# Patient Record
Sex: Female | Born: 1985
Health system: Southern US, Community
[De-identification: ages and names within clinical notes are randomized; demographics above are authoritative.]

## PROBLEM LIST (undated history)

## (undated) DIAGNOSIS — M7730 Calcaneal spur, unspecified foot: Secondary | ICD-10-CM

## (undated) DIAGNOSIS — F319 Bipolar disorder, unspecified: Secondary | ICD-10-CM

## (undated) DIAGNOSIS — I10 Essential (primary) hypertension: Secondary | ICD-10-CM

## (undated) DIAGNOSIS — E282 Polycystic ovarian syndrome: Secondary | ICD-10-CM

## (undated) DIAGNOSIS — Q780 Osteogenesis imperfecta: Secondary | ICD-10-CM

---

## 2018-02-25 ENCOUNTER — Other Ambulatory Visit: Payer: Self-pay

## 2018-02-25 ENCOUNTER — Emergency Department (HOSPITAL_COMMUNITY)
Admission: EM | Admit: 2018-02-25 | Discharge: 2018-02-26 | Disposition: A | Discharge status: Home / Self Care | Payer: Self-pay | Attending: Emergency Medicine | Admitting: Emergency Medicine

## 2018-02-25 ENCOUNTER — Encounter (HOSPITAL_COMMUNITY): Payer: Self-pay | Admitting: Emergency Medicine

## 2018-02-25 DIAGNOSIS — H9201 Otalgia, right ear: Secondary | ICD-10-CM | POA: Insufficient documentation

## 2018-02-25 DIAGNOSIS — I10 Essential (primary) hypertension: Secondary | ICD-10-CM | POA: Insufficient documentation

## 2018-02-25 HISTORY — DX: Bipolar disorder, unspecified: F31.9

## 2018-02-25 HISTORY — DX: Polycystic ovarian syndrome: E28.2

## 2018-02-25 HISTORY — DX: Essential (primary) hypertension: I10

## 2018-02-25 MED ORDER — AZITHROMYCIN 250 MG PO TABS: 250.0000 mg | ORAL_TABLET | Freq: Every day | ORAL | 0 refills | Status: DC

## 2018-02-25 MED ORDER — OFLOXACIN 0.3 % OT SOLN: 5.0000 [drp] | Freq: Two times a day (BID) | OTIC | 0 refills | Status: AC

## 2018-02-25 NOTE — Discharge Instructions (Signed)
Please take all medications as directed.  You are given a referral to the ear nose and throat doctor.  Please call the office to make an appointment for follow-up.  You are also given information to [facility] in regards to your symptoms today.  Please make an appointment for follow-up in 1 week for reevaluation.  Please return to the ER for any new or worsening symptoms in the meantime.

## 2018-02-25 NOTE — ED Provider Notes (Signed)
[facility] Provider Note   CSN: [id number] Arrival date & time: 02/25/18  1936     History   Chief Complaint Chief Complaint  Patient presents with  . Otalgia    HPI Sara Pearson is a 32 y.o. female.  HPI   Patient is a 32 year old female history of bipolar 1, hypertension, PCOS who presents emergency department today for evaluation of right ear pain that has been ongoing for the last 2 3 days.  She reports pain and drainage of green fluid and blood from the right ear.  States she is Q-tips.  Reports chills but has not had any documented fevers at home.  Has had some rhinorrhea and a sore throat.  Denies any other symptoms.  Has not tried any interventions for her symptoms.  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  States she has been swimming recently and is supposed to wear earplugs when she swims that she often so she did not wear them recently.  Past Medical History:  Diagnosis Date  . Bipolar 1 disorder (HCC)   . Hypertension   . PCOS (polycystic ovarian syndrome)     There are no active problems to display for this patient.   History reviewed. No pertinent surgical history.   OB History   None      Home Medications    [place] to Admission medications   Medication Sig Start Date End Date Taking? Authorizing Provider  azithromycin (ZITHROMAX Z-PAK) 250 MG tablet Take 1 tablet (250 mg total) by mouth daily. Take 2 tablets on the first day of treatment. Then take 1 tablet per day for the next four days. 02/25/18   Xochitl Egle S, PA-C  ofloxacin (FLOXIN) 0.3 % OTIC solution Place 5 drops into the right ear 2 (two) times daily for 7 days. 02/25/18 03/04/18  Mekiyah Gladwell S, PA-C    Family History No family history on file.  Social History Social History   Tobacco Use  . Smoking status: Never Smoker    . Smokeless tobacco: Never Used  Substance Use Topics  . Alcohol use: Yes  . Drug use: Never     Allergies   Codeine; Iodine; Rocephin [ceftriaxone sodium in dextrose]; and Zofran [ondansetron hcl]   Review of Systems Review of Systems  Constitutional: Positive for chills. Negative for fever.  HENT: Positive for ear pain, postnasal drip, rhinorrhea and sore throat.   Eyes: Negative for visual disturbance.  Respiratory: Negative for shortness of breath.   Cardiovascular: Negative for chest pain.  Gastrointestinal: Negative for abdominal pain, nausea and vomiting.  Genitourinary: Negative for dysuria.  Neurological: Negative for headaches.   Physical Exam Updated Vital Signs BP (!) 142/98 (BP Location: Right Arm)   Pulse (!) 104   Temp 98.7 F (37.1 C) (Oral)   Resp 16   SpO2 100%   Physical Exam  Constitutional: She appears well-developed and well-nourished. No distress.  HENT:  Head: Normocephalic and atraumatic.  Left external ear normal.  Left TM unable to be fully visualized.  Right canal appears raw and erythematous.  There is no effusion behind the right TM.  No obvious TM perforation.  No obvious erythema.  No pharyngeal erythema.  No tonsillar swelling or exudates noted.  Postnasal drip noted.  Tolerating secretions.  Eyes: Conjunctivae and EOM are normal.  Neck: Normal  range of motion. Neck supple.  Cardiovascular: Normal rate and regular rhythm.  Pulmonary/Chest: Effort normal and breath sounds normal. She has no wheezes.  Abdominal: Soft. There is no guarding.  Musculoskeletal: Normal range of motion.  Lymphadenopathy:    She has no cervical adenopathy.  Neurological: She is alert.  Skin: Skin is warm and dry.  Psychiatric: She has a normal mood and affect.  Nursing note and vitals reviewed.  ED Treatments / Results  Labs (all labs ordered are listed, but only abnormal results are displayed) Labs Reviewed - No data to  display  EKG None  Radiology No results found.  Procedures Procedures (including critical care time)  Medications Ordered in ED Medications - No data to display   Initial Impression / Assessment and Plan / ED Course  I have reviewed the triage vital signs and the nursing notes.  Pertinent labs & imaging results that were available during my care of the patient were reviewed by me and considered in my medical decision making (see chart for details).   10:44 PM pt not in room   Final Clinical Impressions(s) / ED Diagnoses   Final diagnoses:  Otalgia of right ear   Patient presenting with right-sided otalgia for the last 2 to 3 days.  States that she has had pain and drainage from the right ear.  Recently went swimming.  No fever here.  Borderline tachycardic but otherwise vitals stable.  No evidence of mastoiditis or other emergent process at this time.  Given her history, will treat patient with otic drops.  Given her effusion will also cover her for a potential otitis media.  Given referral to ENT.  Advised follow-up with [facility] for reevaluation.  Return precautions discussed.  Patient and friend at bedside voiced understanding the plan and reasons to return immediately to the ED.  All questions answered  ED Discharge Orders         Ordered    ofloxacin (FLOXIN) 0.3 % OTIC solution  2 times daily     02/25/18 2347    azithromycin (ZITHROMAX Z-PAK) 250 MG tablet  Daily     02/25/18 2347           Karrie Meres, PA-C 02/25/18 2355    Terrilee Files, MD 02/26/18 1104

## 2018-02-25 NOTE — ED Triage Notes (Signed)
Pt c/o pain in her right ear with some drainage x 2 days.

## 2018-02-25 NOTE — ED Notes (Signed)
See EDP assessment 

## 2019-03-14 ENCOUNTER — Other Ambulatory Visit: Payer: Self-pay

## 2019-03-14 ENCOUNTER — Emergency Department (HOSPITAL_COMMUNITY)
Admission: EM | Admit: 2019-03-14 | Discharge: 2019-03-14 | Disposition: A | Discharge status: Home / Self Care | Payer: Self-pay | Attending: Emergency Medicine | Admitting: Emergency Medicine

## 2019-03-14 ENCOUNTER — Encounter (HOSPITAL_COMMUNITY): Payer: Self-pay

## 2019-03-14 DIAGNOSIS — R070 Pain in throat: Secondary | ICD-10-CM | POA: Insufficient documentation

## 2019-03-14 DIAGNOSIS — I10 Essential (primary) hypertension: Secondary | ICD-10-CM | POA: Insufficient documentation

## 2019-03-14 LAB — CBC WITH DIFFERENTIAL/PLATELET
Abs Immature Granulocytes: 0.06 10*3/uL (ref 0.00–0.07)
Basophils Absolute: 0.1 10*3/uL (ref 0.0–0.1)
Basophils Relative: 0 %
Eosinophils Absolute: 0.3 10*3/uL (ref 0.0–0.5)
Eosinophils Relative: 2 %
HCT: 44 % (ref 36.0–46.0)
Hemoglobin: 14.9 g/dL (ref 12.0–15.0)
Immature Granulocytes: 0 %
Lymphocytes Relative: 34 %
Lymphs Abs: 5.2 10*3/uL — ABNORMAL HIGH (ref 0.7–4.0)
MCH: 28.2 pg (ref 26.0–34.0)
MCHC: 33.9 g/dL (ref 30.0–36.0)
MCV: 83.2 fL (ref 80.0–100.0)
Monocytes Absolute: 0.7 10*3/uL (ref 0.1–1.0)
Monocytes Relative: 5 %
Neutro Abs: 8.9 10*3/uL — ABNORMAL HIGH (ref 1.7–7.7)
Neutrophils Relative %: 59 %
Platelets: 231 10*3/uL (ref 150–400)
RBC: 5.29 MIL/uL — ABNORMAL HIGH (ref 3.87–5.11)
RDW: 12.5 % (ref 11.5–15.5)
WBC: 15.2 10*3/uL — ABNORMAL HIGH (ref 4.0–10.5)
nRBC: 0 % (ref 0.0–0.2)

## 2019-03-14 LAB — BASIC METABOLIC PANEL
Anion gap: 8 (ref 5–15)
BUN: 6 mg/dL (ref 6–20)
CO2: 26 mmol/L (ref 22–32)
Calcium: 9.4 mg/dL (ref 8.9–10.3)
Chloride: 104 mmol/L (ref 98–111)
Creatinine, Ser: 0.66 mg/dL (ref 0.44–1.00)
GFR calc Af Amer: >60 mL/min (ref >60)
GFR calc non Af Amer: >60 mL/min (ref >60)
Glucose, Bld: 97 mg/dL (ref 70–99)
Potassium: 3.8 mmol/L (ref 3.5–5.1)
Sodium: 138 mmol/L (ref 135–145)

## 2019-03-14 LAB — GROUP A STREP BY PCR: Group A Strep by PCR: NOT DETECTED

## 2019-03-14 MED ORDER — DEXAMETHASONE SODIUM PHOSPHATE 10 MG/ML IJ SOLN
10.0000 mg | Freq: Once | INTRAMUSCULAR | Status: AC
  Administered 2019-03-14: 10 mg via INTRAVENOUS
  Filled 2019-03-14: qty 1

## 2019-03-14 MED ORDER — DOXYCYCLINE HYCLATE 100 MG PO CAPS: 100.0000 mg | ORAL_CAPSULE | Freq: Two times a day (BID) | ORAL | 0 refills | Status: AC

## 2019-03-14 NOTE — ED Provider Notes (Signed)
[facility] Provider [facility]   CSN: [id number] Arrival date & time: 03/14/19  1546     History   Chief Complaint Chief Complaint  Patient presents with  . Allergic Reaction    HPI Sara Pearson is a 33 y.o. female presenting for evaluation of throat discomfort.  Patient states she was diagnosed with a dental infection on Friday.  Started on clindamycin.  She was taking as prescribed until yesterday when she felt like her symptoms are worsening so she took 2 doses of the clindamycin last night.  She then started to feel like her throat was swollen and it was difficulty to swallow.  She reports pain in her throat.  Patient started to feel a chest heaviness and discomfort.  She states she has taken approximately 20 Benadryl since symptoms began last night.  She has not taken any further doses of the clindamycin.  She denies rash, nausea, vomiting.  Patient denies fever, shortness of breath, chest pain.  She denies history of similar reaction.     HPI  Past Medical History:  Diagnosis Date  . Bipolar 1 disorder (HCC)   . Hypertension   . PCOS (polycystic ovarian syndrome)     There are no active problems to display for this patient.   History reviewed. No pertinent surgical history.   OB History   No obstetric history on file.      Home Medications    [place] to Admission medications   Medication Sig Start Date End Date Taking? Authorizing Provider  azithromycin (ZITHROMAX Z-PAK) 250 MG tablet Take 1 tablet (250 mg total) by mouth daily. Take 2 tablets on the first day of treatment. Then take 1 tablet per day for the next four days. 02/25/18   Couture, Cortni S, PA-C  doxycycline (VIBRAMYCIN) 100 MG capsule Take 1 capsule (100 mg total) by mouth 2 (two) times daily for 7 days. 03/14/19 03/21/19  Daliah Chaudoin, PA-C    Family History No family history on file.  Social History Social History   Tobacco Use  . Smoking status: Never  Smoker  . Smokeless tobacco: Never Used  Substance Use Topics  . Alcohol use: Yes  . Drug use: Never     Allergies   Codeine, Iodine, Rocephin [ceftriaxone sodium in dextrose], and Zofran [ondansetron hcl]   Review of Systems Review of Systems  HENT:       Throat discomfort  Respiratory:       Chest heaviness  All other systems reviewed and are negative.    Physical Exam Updated Vital Signs BP (!) 135/93   Pulse 73   Temp 98.9 F (37.2 C) (Oral)   Resp 16   SpO2 98%   Physical Exam Vitals signs and nursing note reviewed.  Constitutional:      General: She is not in acute distress.    Appearance: She is well-developed.     Comments: Obese female sitting in the chair in no acute distress  HENT:     Head: Normocephalic and atraumatic.     Comments: Mild right-sided facial swelling without swelling extending beyond the ramus of the mandible.  No trismus.  Handling secretions easily.  Uvula midline with equal palate rise.  No tonsillar swelling.  Throat mildly erythematous.  No pain under the tongue. Eyes:     Conjunctiva/sclera: Conjunctivae normal.     Pupils: Pupils are equal, round, and reactive to light.  Neck:     Musculoskeletal: Normal range of  motion and neck supple.  Cardiovascular:     Rate and Rhythm: Normal rate and regular rhythm.     Pulses: Normal pulses.  Pulmonary:     Effort: Pulmonary effort is normal. No respiratory distress.     Breath sounds: Normal breath sounds. No wheezing.     Comments: Speaking in full sentences.  Clear lung sounds in all fields.  No signs of respiratory distress. Abdominal:     General: There is no distension.     Palpations: Abdomen is soft.     Tenderness: There is no abdominal tenderness.  Musculoskeletal: Normal range of motion.  Skin:    General: Skin is warm and dry.  Neurological:     Mental Status: She is alert and oriented to person, place, and time.      ED Treatments / Results  Labs (all labs  ordered are listed, but only abnormal results are displayed) Labs Reviewed  CBC WITH DIFFERENTIAL/PLATELET - Abnormal; Notable for the following components:      Result Value   WBC 15.2 (*)    RBC 5.29 (*)    Neutro Abs 8.9 (*)    Lymphs Abs 5.2 (*)    All other components within normal limits  GROUP A STREP BY PCR  BASIC METABOLIC PANEL    EKG None  Radiology No results found.  Procedures Procedures (including critical care time)  Medications Ordered in ED Medications  dexamethasone (DECADRON) injection 10 mg (10 mg Intravenous Given 03/14/19 1702)     Initial Impression / Assessment and Plan / ED Course  I have reviewed the triage vital signs and the nursing notes.  Pertinent labs & imaging results that were available during my care of the patient were reviewed by me and considered in my medical decision making (see chart for details).        Patient presenting for evaluation of throat discomfort and chest heaviness after taking 2 doses of clindamycin last night.  Physical exam shows patient appears nontoxic.  No rash or signs of anaphylaxis.  Patient is handling secretions well.  No sign of peritonsillar abscess, airway is intact.  No sign of Ludwig's at this time.  Will give Decadron and obtain basic labs, although I have low suspicion that she will need CT.  As she is having throat pain and has a mildly erythematous throat, will obtain strep, although low suspicion as she is currently on antibiotics.  On reassessment after Decadron, patient reports symptoms have completely resolved.  Case discussed with attending, Dr. Langston Masker agrees to plan.  Patient with slight leukocytosis at 15, could be due to dental infection.  Will switch patient's antibiotics in case this is due to allergic reaction, although I favor worsening infection/swelling as cause of the symptoms.  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  Discussed with pharmacy, who  agrees that doxycycline would be a good choice.  Encourage patient to call her dentist tomorrow to discuss what happened and to see if she should still follow-up on Wednesday for dental extraction.  At this time, patient appears safe for discharge.  Return precautions given.  Patient states she understands and agrees to plan.  Final Clinical Impressions(s) / ED Diagnoses   Final diagnoses:  Throat discomfort    ED Discharge Orders         Ordered    doxycycline (VIBRAMYCIN) 100 MG capsule  2 times daily     03/14/19 1839  Alveria Apley, PA-C 03/14/19 1928    Terald Sleeper, MD 03/15/19 [phone]

## 2019-03-14 NOTE — ED Triage Notes (Signed)
Pt reports taking clindamycin for a tooth abscess since Friday and now thinks she has an allergic reaction to it. States yesterday her throat started to swell and she can barely open her mouth. Hard to swallow. Pt maintaining secretions at this time, oxygen saturation 97%.

## 2019-03-14 NOTE — Discharge Instructions (Signed)
Take antibiotics as prescribed.  Take entire course, even if your symptoms improve. Make sure you call your dentist tomorrow to let them know what happened.  They may want to extend your antibiotic treatment for several more days [place] to tooth extraction. Return to the emergency room if you develop fevers, inability to open your mouth, increased difficulty breathing, or any new, worsening, or concerning symptoms.

## 2019-09-01 ENCOUNTER — Ambulatory Visit: Payer: Medicaid Other | Attending: Internal Medicine

## 2019-09-01 DIAGNOSIS — Z23 Encounter for immunization: Secondary | ICD-10-CM

## 2019-09-01 NOTE — Progress Notes (Signed)
   Covid-19 Vaccination Clinic  Name:  Sara Pearson    MRN: [id number] DOB: Oct 09, 1985  09/01/2019  Ms. Ribble was observed post Covid-19 immunization for 30 minutes based on pre-vaccination screening without incident. She was provided with Vaccine Information Sheet and instruction to access the V-Safe system.   Ms. Bloomfield was instructed to call 911 with any severe reactions post vaccine: Marland Kitchen Difficulty breathing  . Swelling of face and throat  . A fast heartbeat  . A bad rash all over body  . Dizziness and weakness   Immunizations Administered    Name Date Dose VIS Date Route   Pfizer COVID-19 Vaccine 09/01/2019  2:28 PM 0.3 mL 05/27/2019 Intramuscular   Manufacturer: [employer], [employer]   Lot: [id number]   NDC: [address]-4

## 2019-09-12 ENCOUNTER — Encounter (HOSPITAL_COMMUNITY): Payer: Self-pay | Admitting: *Deleted

## 2019-09-12 ENCOUNTER — Emergency Department (HOSPITAL_COMMUNITY)
Admission: EM | Admit: 2019-09-12 | Discharge: 2019-09-12 | Disposition: A | Discharge status: Home / Self Care | Payer: Self-pay | Attending: Emergency Medicine | Admitting: Emergency Medicine

## 2019-09-12 ENCOUNTER — Emergency Department (HOSPITAL_COMMUNITY): Payer: Self-pay

## 2019-09-12 DIAGNOSIS — S93602A Unspecified sprain of left foot, initial encounter: Secondary | ICD-10-CM

## 2019-09-12 DIAGNOSIS — Y92002 Bathroom of unspecified non-institutional (private) residence single-family (private) house as the place of occurrence of the external cause: Secondary | ICD-10-CM | POA: Insufficient documentation

## 2019-09-12 DIAGNOSIS — Q780 Osteogenesis imperfecta: Secondary | ICD-10-CM | POA: Insufficient documentation

## 2019-09-12 DIAGNOSIS — Y999 Unspecified external cause status: Secondary | ICD-10-CM | POA: Insufficient documentation

## 2019-09-12 DIAGNOSIS — Y93E1 Activity, personal bathing and showering: Secondary | ICD-10-CM | POA: Insufficient documentation

## 2019-09-12 DIAGNOSIS — W2209XA Striking against other stationary object, initial encounter: Secondary | ICD-10-CM | POA: Insufficient documentation

## 2019-09-12 HISTORY — DX: Osteogenesis imperfecta: Q78.0

## 2019-09-12 HISTORY — DX: Calcaneal spur, unspecified foot: M77.30

## 2019-09-12 NOTE — ED Triage Notes (Signed)
Left foot pain since she struck her foot on the bathtub. Pt is tearful, hx of bone spurs also

## 2019-09-12 NOTE — ED Provider Notes (Signed)
[facility] EMERGENCY DEPARTMENT Provider Note   CSN: [id number] Arrival date & time: 09/12/19  1647     History Chief Complaint  Patient presents with  . Foot Pain    Sara Pearson is a 34 y.o. female with PMhx HTN, PCOS, and bipolar disorder who presents to the ED today complaining of sudden onset, constant, achy/throbbing, left medial foot pain that occurred earlier today.  States she was getting out of the tub when she accidentally hit the medial aspect of her left foot against the tub causing immediate pain.  States she took one of her boyfriends hydrocodone without relief.  She states she is limping due to the pain.  Notes history of heel spurs but states his pain is worse.  Denies weakness, numbness, tingling, any other associated symptoms.  The history is provided by the patient and medical records.       Past Medical History:  Diagnosis Date  . Bipolar 1 disorder ([place])   . Heel spur   . Hypertension   . Osteogenesis imperfecta   . PCOS (polycystic ovarian syndrome)     There are no problems to display for this patient.   History reviewed. No pertinent surgical history.   OB History   No obstetric history on file.     No family history on file.  Social History   Tobacco Use  . Smoking status: Never Smoker  . Smokeless tobacco: Never Used  Substance Use Topics  . Alcohol use: Yes  . Drug use: Never    Home Medications [place] to Admission medications   Medication Sig Start Date End Date Taking? Authorizing Provider  azithromycin (ZITHROMAX Z-PAK) 250 MG tablet Take 1 tablet (250 mg total) by mouth daily. Take 2 tablets on the first day of treatment. Then take 1 tablet per day for the next four days. 02/25/18   Couture, Cortni S, PA-C    Allergies    Codeine, Iodine, Rocephin [ceftriaxone sodium in dextrose], and Zofran [ondansetron hcl]  Review of Systems   Review of Systems  Constitutional: Negative for chills and fever.   Musculoskeletal: Positive for arthralgias.  Neurological: Negative for weakness and numbness.    Physical Exam Updated Vital Signs BP (!) 145/92 (BP Location: Left Arm)   Pulse 90   Temp 98.7 F (37.1 C) (Oral)   Resp 14   Ht 5\' 6"  (1.676 m)   Wt 127 kg   LMP 08/27/2019   SpO2 97%   BMI 45.19 kg/m   Physical Exam Vitals and nursing note reviewed.  Constitutional:      Appearance: She is not ill-appearing.  HENT:     Head: Normocephalic and atraumatic.  Eyes:     Conjunctiva/sclera: Conjunctivae normal.  Cardiovascular:     Rate and Rhythm: Normal rate and regular rhythm.  Pulmonary:     Effort: Pulmonary effort is normal.     Breath sounds: Normal breath sounds.  Musculoskeletal:     Comments: Mild amount of swelling to the medial aspect of the left foot.  Tenderness to palpation along the medial aspect of the heel.  No tenderness to medial or lateral malleolus of ankle.  No tenderness to dorsum of foot.  Cap refill less than 2 seconds in all digits.  Able to wiggle toes without difficulty.  Range of motion of ankle intact.  Skin:    General: Skin is warm and dry.     Coloration: Skin is not jaundiced.  Neurological:  Mental Status: She is alert.     ED Results / Procedures / Treatments   Labs (all labs ordered are listed, but only abnormal results are displayed) Labs Reviewed - No data to display  EKG None  Radiology DG Foot Complete Left  Result Date: 09/12/2019 CLINICAL DATA:  Left foot pain after trauma EXAM: LEFT FOOT - COMPLETE 3+ VIEW COMPARISON:  None. FINDINGS: Frontal, oblique, lateral views of the left foot are obtained. No fracture, subluxation, or dislocation. Joint spaces are well preserved. Prominent calcaneal spurs. Mild dorsal soft tissue swelling of the forefoot. IMPRESSION: 1. Mild soft tissue swelling.  No underlying fracture. Electronically Signed   By: Sharlet Salina M.D.   On: 09/12/2019 18:53    Procedures Procedures (including  critical care time)  Medications Ordered in ED Medications - No data to display  ED Course  I have reviewed the triage vital signs and the nursing notes.  Pertinent labs & imaging results that were available during my care of the patient were reviewed by me and considered in my medical decision making (see chart for details).    MDM Rules/Calculators/A&P                      34 year old female presents to the ED today complaining of sudden onset left foot pain after hitting it again at the edge of the tub.  She has mild amount of swelling to the medial aspect with tenderness to palpation.  Motion intact to ankle and toes.  SHe is neurovascularly intact.  X-ray was obtained without any signs of fracture.  Suspect soft tissue injury.  Ace wrap applied and patient given crutches to help with discomfort with ambulation.  She is advised to take ibuprofen and Tylenol as needed for pain.  Rice therapy discussed.  Patient given information for PCP as well as podiatry to follow-up with.  She is in agreement with plan and stable for discharge home.  This note was prepared using Dragon voice recognition software and may include unintentional dictation errors due to the inherent limitations of voice recognition software.  Final Clinical Impression(s) / ED Diagnoses Final diagnoses:  Sprain of left foot, initial encounter    Rx / DC Orders ED Discharge Orders    None       Discharge Instructions     Please use crutches and ace wrap to help with discomfort and pain While at home please ice and elevate  your foot  You may take Ibuprofen and Tylenol as needed for pain Follow up with podiatry if you pain continues        Tanda Rockers, PA-C 09/12/19 2019    Milagros Loll, MD 09/13/19 [phone]

## 2019-09-12 NOTE — Progress Notes (Signed)
Orthopedic Tech Progress Note Patient Details:  Sara Pearson [birth year] [id number]  Ortho Devices Type of Ortho Device: Ace wrap, Crutches Ortho Device/Splint Location: LLE Ortho Device/Splint Interventions: Application   Post Interventions Patient Tolerated: Well Instructions Provided: Care of device   Genelle Bal Sara Pearson 09/12/2019, 8:34 PM

## 2019-09-12 NOTE — Discharge Instructions (Signed)
Please use crutches and ace wrap to help with discomfort and pain While at home please ice and elevate  your foot  You may take Ibuprofen and Tylenol as needed for pain Follow up with podiatry if you pain continues

## 2019-09-27 ENCOUNTER — Ambulatory Visit: Payer: Medicaid Other | Attending: Internal Medicine

## 2019-09-27 DIAGNOSIS — Z23 Encounter for immunization: Secondary | ICD-10-CM

## 2019-09-27 NOTE — Progress Notes (Signed)
   Covid-19 Vaccination Clinic  Name:  Sara Pearson    MRN: [id number] DOB: October 04, 1985  09/27/2019  [name] was observed post Covid-19 immunization for 15 minutes without incident. She was provided with Vaccine Information Sheet and instruction to access the V-Safe system.   [name] was instructed to call 911 with any severe reactions post vaccine: Marland Kitchen Difficulty breathing  . Swelling of face and throat  . A fast heartbeat  . A bad rash all over body  . Dizziness and weakness   Immunizations Administered    Name Date Dose VIS Date Route   Pfizer COVID-19 Vaccine 09/27/2019  2:39 PM 0.3 mL 05/27/2019 Intramuscular   Manufacturer: [employer], [employer]   Lot: [id number]   NDC: [address]-9

## 2020-01-09 ENCOUNTER — Other Ambulatory Visit: Payer: Self-pay

## 2020-01-09 ENCOUNTER — Emergency Department (HOSPITAL_COMMUNITY)
Admission: EM | Admit: 2020-01-09 | Discharge: 2020-01-10 | Disposition: A | Discharge status: Home / Self Care | Payer: Medicaid Other | Attending: Emergency Medicine | Admitting: Emergency Medicine

## 2020-01-09 ENCOUNTER — Encounter (HOSPITAL_COMMUNITY): Payer: Self-pay | Admitting: Emergency Medicine

## 2020-01-09 DIAGNOSIS — R509 Fever, unspecified: Secondary | ICD-10-CM | POA: Insufficient documentation

## 2020-01-09 DIAGNOSIS — Z5321 Procedure and treatment not carried out due to patient leaving prior to being seen by health care provider: Secondary | ICD-10-CM | POA: Insufficient documentation

## 2020-01-09 DIAGNOSIS — L02214 Cutaneous abscess of groin: Secondary | ICD-10-CM | POA: Insufficient documentation

## 2020-01-09 DIAGNOSIS — B9689 Other specified bacterial agents as the cause of diseases classified elsewhere: Secondary | ICD-10-CM | POA: Insufficient documentation

## 2020-01-09 NOTE — ED Triage Notes (Signed)
Pt c/o abscess to right groin, hx of same. Reports fevers at home.

## 2020-04-13 ENCOUNTER — Other Ambulatory Visit: Payer: Self-pay

## 2020-04-13 ENCOUNTER — Ambulatory Visit (HOSPITAL_COMMUNITY)
Admission: EM | Admit: 2020-04-13 | Discharge: 2020-04-13 | Disposition: A | Discharge status: Home / Self Care | Payer: No Payment, Other | Attending: Psychiatry | Admitting: Psychiatry

## 2020-04-13 DIAGNOSIS — Z9151 Personal history of suicidal behavior: Secondary | ICD-10-CM | POA: Insufficient documentation

## 2020-04-13 DIAGNOSIS — F3162 Bipolar disorder, current episode mixed, moderate: Secondary | ICD-10-CM | POA: Diagnosis not present

## 2020-04-13 DIAGNOSIS — F1290 Cannabis use, unspecified, uncomplicated: Secondary | ICD-10-CM | POA: Insufficient documentation

## 2020-04-13 NOTE — ED Notes (Signed)
Pt discharged in no acute distress. Verbalized understanding of AVS instructions explained by Clinical research associate. All belongings returned to pt intact from locker #29. Denies SI/HI upon discharge. Pt escorted to lobby via staff. Safety maintained.

## 2020-04-13 NOTE — BH Assessment (Signed)
Comprehensive Clinical Assessment (CCA) Note  04/13/2020 Sara Pearson [id number]  Sara Pearson is a 34 year old female presenting to [facility] voluntarily with chief complaint of depression. Sara Pearson states that she moved here two years ago from [place] where she was financially stable and able to afford medication management and therapy. Patient reports moving here to get better health care for her husband who has muscular dystrophy. Patient reports some financial strain and difficulty with getting mental health treatment due to no insurance. Patient reports that she has been unmedicated for the past two years and feels trapped in sadness. Patient reports stressors of starting a new job, taking care of her husband and financial issues Im under a lot of stress. Patient reports symptoms of crying spells, low energy, difficulty sleeping, fatigue and difficulty concentrating. Patient denies current SI but has attempted suicidal multiple times. Patient states her last suicidal attempt was in 2013 via overdose. Patient states that she has been inpatient 3 times and her last inpatient treatment was in 2014. Patient reports a lot of trauma has happened in her life. Patient reports being diagnosed with bipolar disorder in 2010 and has a history of taking lithium. Patient reports that she went to [facility] for help, and they instructed her to come to [facility]. Patient states she was under the impression that [facility] provided 24-hour outpatient services. Patient states multiple times she is not a threat to herself or anyone else and all she needs is therapy and medications. Patient expresses her frustration of how hard it is to get medical and mental health treatment without insurance. Patient has also started a new job and works 8-5 and reports difficulty with trying to make it to open access for services. Patient was instructed to call [facility] and schedule an appointment for services if she is not  able to make it to open access.    Per Sara Heinrich, NP, patient is recommended for discharge with follow up to [facility] for outpatient services.   Visit Diagnosis:      ICD-10-CM   1. Bipolar 1 disorder, mixed, moderate (HCC)  F31.62       CCA Screening, Triage and Referral (STR)  Patient Reported Information How did you hear about [place]? Other (Comment)  Referral name: [facility]  Referral phone number: No data recorded  Whom do you see for routine medical problems? I don't have a doctor  Practice/Facility Name: No data recorded Practice/Facility Phone Number: No data recorded Name of Contact: No data recorded Contact Number: No data recorded Contact Fax Number: No data recorded Prescriber Name: No data recorded Prescriber Address (if known): No data recorded  What Is the Reason for Your Visit/Call Today? Depression  How Long Has This Been Causing You Problems? > than 6 months  What Do You Feel Would Help You the Most Today? Medication;Therapy   Have You Recently Been in Any Inpatient Treatment (Hospital/Detox/Crisis Center/28-Day Program)? No  Name/Location of Program/Hospital:No data recorded How Long Were You There? No data recorded When Were You Discharged? No data recorded  Have You Ever Received Services From [facility] Before? Yes  Who Do You See at [facility]? ER   Have You Recently Had Any Thoughts About Hurting Yourself? No  Are You Planning to Commit Suicide/Harm Yourself At This time? No   Have you Recently Had Thoughts About Hurting Someone Sara Pearson? No  Explanation: No data recorded  Have You Used Any Alcohol or Drugs in the Past 24  Hours? Yes  How Long Ago Did You Use Drugs or Alcohol? No data recorded What Did You Use and How Much? 1 joint   Do You Currently Have a Therapist/Psychiatrist? No  Name of Therapist/Psychiatrist: No data recorded  Have You Been Recently Discharged From Any Office Practice or Programs?  No  Explanation of Discharge From Practice/Program: No data recorded    CCA Screening Triage Referral Assessment Type of Contact: Face-to-Face  Is this Initial or Reassessment? No data recorded Date Telepsych consult ordered in [facility]:  No data recorded Time Telepsych consult ordered in [facility]:  No data recorded  Patient Reported Information Reviewed? Yes  Patient Left Without Being Seen? No data recorded Reason for Not Completing Assessment: No data recorded  Collateral Involvement: None   Does Patient Have a Court Appointed Legal Guardian? No data recorded Name and Contact of Legal Guardian: No data recorded If Minor and Not Living with Parent(s), Who has Custody? No data recorded Is CPS involved or ever been involved? No data recorded Is APS involved or ever been involved? No data recorded  Patient Determined To Be At Risk for Harm To Self or Others Based on Review of Patient Reported Information or Presenting Complaint? No  Method: No data recorded Availability of Means: No data recorded Intent: No data recorded Notification Required: No data recorded Additional Information for Danger to Others Potential: No data recorded Additional Comments for Danger to Others Potential: No data recorded Are There Guns or Other Weapons in Your Home? No data recorded Types of Guns/Weapons: No data recorded Are These Weapons Safely Secured?                            No data recorded Who Could Verify You Are Able To Have These Secured: No data recorded Do You Have any Outstanding Charges, Pending Court Dates, Parole/Probation? No data recorded Contacted To Inform of Risk of Harm To Self or Others: No data recorded  [facility]   Does Patient Present under Involuntary Commitment? No  IVC Papers Initial File Date: No data recorded  [place]   Patient Currently Receiving the Following Services: No data recorded  Determination  of Need: No data recorded  Options For Referral: Medication Management;Intensive Outpatient Therapy     CCA Biopsychosocial  Intake/Chief Complaint:  CCA Intake With Chief Complaint CCA Part Two Date: 04/13/20 Chief Complaint/Presenting Problem: Depression Patient's Currently Reported Symptoms/Problems: Patient reports symptoms of crying spells, low energy, difficulty sleeping, fatigue and difficulty concentrating Individual's Strengths: UTA Individual's Preferences: UTA Individual's Abilities: UTA Type of Services Patient Feels Are Needed: Medication and therapy  Mental Health Symptoms Depression:  Depression: Fatigue, Sleep (too much or little), Tearfulness, Duration of symptoms greater than two weeks, Change in energy/activity  Mania:  Mania: None  Anxiety:   Anxiety: Sleep  Psychosis:  Psychosis: None  Trauma:  Trauma: None  Obsessions:  Obsessions: None  Compulsions:  Compulsions: None  Inattention:  Inattention: None  Hyperactivity/Impulsivity:  Hyperactivity/Impulsivity: N/A  Oppositional/Defiant Behaviors:  Oppositional/Defiant Behaviors: None  Emotional Irregularity:  Emotional Irregularity: None  Other Mood/Personality Symptoms:      Mental Status Exam Appearance and self-care  Stature:  Stature: Average  Weight:  Weight: Overweight  Clothing:  Clothing: Age-appropriate  Grooming:  Grooming: Normal  Cosmetic use:  Cosmetic Use: None  Posture/gait:  Posture/Gait: Normal  Motor activity:  Motor Activity: Not Remarkable  Sensorium  Attention:  Attention: Normal  Concentration:  Concentration: Normal  Orientation:  Orientation: Time, Situation, Place, Person  Recall/memory:  Recall/Memory: Normal  Affect and Mood  Affect:  Affect: Full Range  Mood:  Mood: Depressed  Relating  Eye contact:  Eye Contact: Normal  Facial expression:  Facial Expression: Responsive  Attitude toward examiner:  Attitude Toward Examiner: Cooperative  Thought and Language  Speech  flow: Speech Flow: Clear and Coherent  Thought content:  Thought Content: Appropriate to Mood and Circumstances  Preoccupation:  Preoccupations: None  Hallucinations:  Hallucinations: None  Organization:     Company secretary of Knowledge:  Fund of Knowledge: Good  Intelligence:  Intelligence: Average  Abstraction:  Abstraction: Normal  Judgement:  Judgement: Good  Reality Testing:  Reality Testing: Adequate  Insight:  Insight: Good  Decision Making:  Decision Making: Normal  Social Functioning  Social Maturity:  Social Maturity: Responsible  Social Judgement:  Social Judgement: Normal  Stress  Stressors:  Stressors: Illness, Work, Transitions, Doctor, hospital Ability:  Coping Ability: Building surveyor Deficits:  Skill Deficits: None  Supports:  Supports: Family     Religion:    Leisure/Recreation:    Exercise/Diet: Exercise/Diet Do You Have Any Trouble Sleeping?: Yes   CCA Employment/Education  Employment/Work Situation: Employment / Work Environmental consultant job has been impacted by current illness: No Where was the patient employed at that time?: [employer] Has patient ever been in the [employer]?: No  Education: Education Is Patient Currently Attending School?: No Did Garment/textile technologist From [employer]?: Yes Did Theme park manager?: Yes Did Designer, television/film set?:  (UTA)   CCA Family/Childhood History  Family and Relationship History: Family history Marital status: Married What types of issues is patient dealing with in the relationship?: Takes care of husband due to medical issues What is your sexual orientation?: Heterosexual Does patient have children?: No  Childhood History:  Childhood History Additional childhood history information: UTA Description of patient's relationship with caregiver when they were a child: UTA Patient's description of current relationship with people who raised him/her: UTA How were you  disciplined when you got in trouble as a child/adolescent?: UTA  Child/Adolescent Assessment:     CCA Substance Use  Alcohol/Drug Use: Alcohol / Drug Use Pain Medications: See MAR Prescriptions: See MAR Over the Counter: See MAR History of alcohol / drug use?: Yes Substance #1 Name of Substance 1: Marijuana 1 - Amount (size/oz): "1 joint" 1 - Frequency: daily 1 - Duration: ongoing 1 - Last Use / Amount: "this morning" Substance #2 Name of Substance 2: Alcohol 2 - Amount (size/oz): "2 glasses of wine" 2 - Frequency: daily 2 - Duration: ongoing 2 - Last Use / Amount: last night     ASAM's:  Six Dimensions of Multidimensional Assessment  Dimension 1:  Acute Intoxication and/or Withdrawal Potential:      Dimension 2:  Biomedical Conditions and Complications:      Dimension 3:  Emotional, Behavioral, or Cognitive Conditions and Complications:     Dimension 4:  Readiness to Change:     Dimension 5:  Relapse, Continued use, or Continued Problem Potential:     Dimension 6:  Recovery/Living Environment:     ASAM Severity Score:    ASAM Recommended Level of Treatment:     Substance use Disorder (SUD)    Recommendations for Services/Supports/Treatments: Recommendations for Services/Supports/Treatments Recommendations For Services/Supports/Treatments: Medication Management, Individual Therapy   Per Sara Heinrich, NP, patient is recommended for discharge with  follow up to [facility] for outpatient services.  Nimrat Woolworth L Janee Morn

## 2020-04-13 NOTE — ED Triage Notes (Signed)
34 yo female walk-in due to increased depression and feeling overwhelmed due to being caregiver of disabled husband, starting a new job and financial stressors. Denies SI/HI/AVH. States, "I just really need help with medication to manage my depression and some therapy. Elevated BP 159/112. Pt states, "I'm unable to pay for medication for my blood pressure". Writer provided education on consequences of uncontrolled BP. Pt verbalized understanding. NP made aware.

## 2020-04-13 NOTE — Discharge Instructions (Addendum)

## 2020-04-13 NOTE — ED Provider Notes (Signed)
Behavioral Health Urgent Care Medical Screening Exam  Patient Name: Sara Pearson MRN: [id number] Date of Evaluation: 04/13/20 Chief Complaint:   Diagnosis:  Final diagnoses:  Bipolar 1 disorder, mixed, moderate (HCC)    History of Present illness: Sara Pearson is a 34 y.o. female.  Patient presents voluntarily to [facility] behavioral [facility] center for walk-in assessment.  Patient reports "I would like to get some therapy and get back on the medications."  Patient reports she relocated from [place] approximately 2 years ago.  Patient reports history of bipolar 1 disorder but has not received therapy or medication management in approximately 2 years and she does not have health insurance.  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  Patient also reports she would like to "be able to focus a little bit better at work."  Patient resides in [place] with her husband and pets.  Patient denies access to weapons.  Patient is currently employed as a Scientist, physiological.  Patient endorses alcohol use approximately 1 glass of wine per day and marijuana use daily.  Patient denies substance use aside from marijuana.  Patient assessed by nurse practitioner.  Patient alert and oriented, answers appropriately.  Patient pleasant cooperative during assessment.  Patient denies suicidal ideations.  Patient reports history of suicide attempts in the past, states "quite a few times but the last time was in 2013."  Patient contracts verbally for safety at this time.  Patient states "that is not why I am here, I am safe."  Patient reports average appetite and sleeps approximately 8 hours per night.  Patient denies homicidal ideations.  Patient denies both auditory  and visual hallucinations.  There is no evidence of delusional thought content and patient does not appear to be responding to internal stimuli.  Patient denies symptoms of paranoia.  Patient reports history of hypertension, states she does not currently have outpatient provider.  Patient denies any chest pain, shortness of breath, headache etc.  D[facility]ed [facility] as patient reports inability to receive care related to lack of health insurance. Patient encouraged to follow-up at urgent care or emergency department as needed.  Patient verbalizes understanding.  Patient offered support and encouragement.  Psychiatric Specialty Exam  Presentation  General Appearance:Appropriate for Environment;Casual  Eye Contact:Good  Speech:Clear and Coherent;Normal Rate  Speech Volume:Normal  Handedness:Right   Mood and Affect  Mood:Euthymic  Affect:Appropriate;Congruent   Thought Process  Thought Processes:Coherent;Goal Directed  Descriptions of Associations:Intact  Orientation:Full (Time, Place and Person)  Thought Content:Logical;WDL  Hallucinations:None  Ideas of Reference:None  Suicidal Thoughts:No  Homicidal Thoughts:No   Sensorium  Memory:Immediate Good;Recent Good;Remote Good  Judgment:Good  Insight:Good   Executive Functions  Concentration:Good  Attention Span:Good  Recall:Good  Fund of Knowledge:Good  Language:Good   Psychomotor Activity  Psychomotor Activity:Normal   Assets  Assets:Communication Skills;Desire for Improvement;Financial Resources/Insurance;Housing;Intimacy;Physical Health;Resilience;Social Support;Transportation;Vocational/Educational   Sleep  Sleep:Good  Number of hours: No data recorded  Physical Exam: Physical Exam Vitals and nursing note reviewed.  Constitutional:      Appearance: She is well-developed.  HENT:     Head: Normocephalic.   Cardiovascular:     Rate and Rhythm: Normal rate.  Pulmonary:  Effort: Pulmonary effort is normal.  Neurological:     Mental Status: She is alert and oriented to person, place, and time.  Psychiatric:        Attention and Perception: Attention and perception normal.        Mood and Affect: Mood and affect normal.        Speech: Speech normal.        Behavior: Behavior normal. Behavior is cooperative.        Thought Content: Thought content normal.        Cognition and Memory: Cognition and memory normal.        Judgment: Judgment normal.    Review of Systems  Constitutional: Negative.   HENT: Negative.   Eyes: Negative.   Respiratory: Negative.   Cardiovascular: Negative.   Gastrointestinal: Negative.   Genitourinary: Negative.   Musculoskeletal: Negative.   Skin: Negative.   Neurological: Negative.   Endo/Heme/Allergies: Negative.   Psychiatric/Behavioral: Positive for substance abuse.   Blood pressure (!) 159/112, pulse 76, temperature 98.7 F (37.1 C), temperature source Oral, resp. rate 18, height 5\' 6"  (1.676 m), weight 282 lb (127.9 kg), SpO2 100 %. Body mass index is 45.52 kg/m.  Musculoskeletal: Strength & Muscle Tone: within normal limits Gait & Station: normal Patient leans: N/A   [facility] MSE Discharge Disposition for Follow up and Recommendations: Based on my evaluation the patient does not appear to have an emergency medical condition and can be discharged with resources and follow up care in outpatient services for Medication Management and Individual Therapy  Patient reviewed with Dr. . Patient agrees with plan to follow-up with outpatient psychiatry and counseling at [facility].    [facility], FNP 04/13/2020, 6:20 PM

## 2020-04-16 ENCOUNTER — Other Ambulatory Visit: Payer: Self-pay

## 2020-04-16 ENCOUNTER — Ambulatory Visit (INDEPENDENT_AMBULATORY_CARE_PROVIDER_SITE_OTHER): Payer: No Payment, Other | Admitting: Licensed Clinical Social Worker

## 2020-04-16 DIAGNOSIS — F411 Generalized anxiety disorder: Secondary | ICD-10-CM

## 2020-04-16 DIAGNOSIS — F3130 Bipolar disorder, current episode depressed, mild or moderate severity, unspecified: Secondary | ICD-10-CM | POA: Insufficient documentation
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
## 2020-04-20 ENCOUNTER — Telehealth (INDEPENDENT_AMBULATORY_CARE_PROVIDER_SITE_OTHER): Payer: No Payment, Other | Admitting: Psychiatry

## 2020-04-20 ENCOUNTER — Other Ambulatory Visit (HOSPITAL_COMMUNITY): Payer: Self-pay | Admitting: Psychiatry

## 2020-04-20 ENCOUNTER — Other Ambulatory Visit: Payer: Self-pay

## 2020-04-20 ENCOUNTER — Encounter (HOSPITAL_COMMUNITY): Payer: Self-pay | Admitting: Psychiatry

## 2020-04-20 DIAGNOSIS — F322 Major depressive disorder, single episode, severe without psychotic features: Secondary | ICD-10-CM | POA: Diagnosis not present

## 2020-04-20 DIAGNOSIS — F411 Generalized anxiety disorder: Secondary | ICD-10-CM | POA: Insufficient documentation

## 2020-04-20 DIAGNOSIS — F4310 Post-traumatic stress disorder, unspecified: Secondary | ICD-10-CM | POA: Diagnosis not present

## 2020-04-20 DIAGNOSIS — F3160 Bipolar disorder, current episode mixed, unspecified: Secondary | ICD-10-CM | POA: Insufficient documentation

## 2020-04-20 MED ORDER — MIRTAZAPINE 15 MG PO TABS: 15.0000 mg | ORAL_TABLET | Freq: Every day | ORAL | 2 refills | Status: DC

## 2020-04-20 MED ORDER — HYDROXYZINE HCL 10 MG PO TABS: 10.0000 mg | ORAL_TABLET | Freq: Three times a day (TID) | ORAL | 2 refills | Status: DC | PRN

## 2020-04-20 MED ORDER — DIVALPROEX SODIUM ER 250 MG PO TB24: 250.0000 mg | ORAL_TABLET | Freq: Two times a day (BID) | ORAL | 2 refills | Status: DC

## 2020-04-20 MED ORDER — PRAZOSIN HCL 1 MG PO CAPS: 1.0000 mg | ORAL_CAPSULE | Freq: Every day | ORAL | 2 refills | Status: DC

## 2020-04-20 MED ORDER — QUETIAPINE FUMARATE 50 MG PO TABS: 50.0000 mg | ORAL_TABLET | Freq: Every day | ORAL | 2 refills | Status: DC

## 2020-04-20 MED ORDER — LAMOTRIGINE 25 MG PO TABS: 25.0000 mg | ORAL_TABLET | Freq: Every day | ORAL | 2 refills | Status: DC

## 2020-04-20 MED FILL — lamoTRIgine 25 MG TABS: 25 | 30 days supply | Qty: 30 | Fill #0

## 2020-04-20 MED FILL — hydrOXYzine HCL 10 MG TABS: 10 | 30 days supply | Qty: 90 | Fill #0

## 2020-04-20 MED FILL — MIRTAZAPINE 15 MG TABLET: 15 | 30 days supply | Qty: 30 | Fill #0

## 2020-04-20 MED FILL — DIVALPROEX SOD ER 250 MG TA: 250 | 15 days supply | Qty: 30 | Fill #0

## 2020-04-20 MED FILL — QUETIAPINE FUMARATE 50 MG T: 50 | 30 days supply | Qty: 30 | Fill #0

## 2020-04-20 MED FILL — PRAZOSIN 1 MG CAPSULE: 1 | 30 days supply | Qty: 30 | Fill #0
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
## 2020-05-04 ENCOUNTER — Telehealth (HOSPITAL_COMMUNITY): Payer: Self-pay

## 2020-05-04 ENCOUNTER — Other Ambulatory Visit (HOSPITAL_COMMUNITY): Payer: Self-pay | Admitting: Psychiatry

## 2020-05-04 DIAGNOSIS — F3160 Bipolar disorder, current episode mixed, unspecified: Secondary | ICD-10-CM

## 2020-05-04 DIAGNOSIS — F322 Major depressive disorder, single episode, severe without psychotic features: Secondary | ICD-10-CM

## 2020-05-04 DIAGNOSIS — F411 Generalized anxiety disorder: Secondary | ICD-10-CM

## 2020-05-04 MED ORDER — QUETIAPINE FUMARATE 100 MG PO TABS: 100.0000 mg | ORAL_TABLET | Freq: Every day | ORAL | 2 refills | Status: DC

## 2020-05-04 MED ORDER — HYDROXYZINE HCL 25 MG PO TABS: 25.0000 mg | ORAL_TABLET | Freq: Three times a day (TID) | ORAL | 2 refills | Status: DC | PRN

## 2020-05-04 MED ORDER — LAMOTRIGINE 25 MG PO TABS: 50.0000 mg | ORAL_TABLET | Freq: Every day | ORAL | 2 refills | Status: DC

## 2020-05-04 MED FILL — lamoTRIgine 25 MG TABS: 25 | 30 days supply | Qty: 60 | Fill #0

## 2020-05-04 MED FILL — QUETIAPINE FUMARATE 100 MG: 100 | 30 days supply | Qty: 30 | Fill #0

## 2020-05-04 NOTE — Telephone Encounter (Signed)
Patient informed provider that since her last visit she has been experiencing increased anxiety, depression, crying spells, paranoia, and VH (noting that she sees shadows). She is agreeable to increase hydroxyzine 10 mg to 25 mg to help manage anxiety. She is also agreeable to increase Seroquel 50 mg to 100 mg to help manage symptoms of psychosis. On 05/11/2020 patient will increase her Lamictal for mood stabilization. She will follow up with provider on 05/18/2020 for further evaluation. Patient informed provider that she will take two of her 50 mg of Seroquel and two of her 10 mg of hydroxyzine until she can afford to pick up her new prescriptions. Provider endorsed understanding. No other concerns noted at this time.

## 2020-05-04 NOTE — Telephone Encounter (Signed)
Patient called and stated that she's shaking badly and she's having crying spells that have worsened. She also stated that she can't focus and everything's foggy and can't get her thoughts together. She feels very unstable and doesn't want to keep feeling this way. Please review and advise. Thank you.

## 2020-05-05 LAB — VALPROIC ACID LEVEL: Valproic Acid Lvl: 27 ug/mL — ABNORMAL LOW (ref 50–100)

## 2020-05-08 ENCOUNTER — Telehealth (HOSPITAL_COMMUNITY): Payer: Self-pay | Admitting: Psychiatry

## 2020-05-08 ENCOUNTER — Other Ambulatory Visit (HOSPITAL_COMMUNITY): Payer: Self-pay | Admitting: Psychiatry

## 2020-05-08 DIAGNOSIS — F3160 Bipolar disorder, current episode mixed, unspecified: Secondary | ICD-10-CM

## 2020-05-08 MED ORDER — DIVALPROEX SODIUM ER 500 MG PO TB24: 500.0000 mg | ORAL_TABLET | Freq: Two times a day (BID) | ORAL | 2 refills | Status: DC

## 2020-05-08 MED ORDER — ARIPIPRAZOLE 5 MG PO TABS: 5.0000 mg | ORAL_TABLET | Freq: Every day | ORAL | 2 refills | Status: DC

## 2020-05-08 MED FILL — DIVALPROEX SOD ER 500 MG TA: 500 | 30 days supply | Qty: 60 | Fill #0

## 2020-05-08 MED FILL — ARIPiprazole 5 MG TABS: 5 | 30 days supply | Qty: 30 | Fill #0

## 2020-05-08 NOTE — Telephone Encounter (Signed)
Patient informed provider she has been having increased anxiety, depression, hypomania, and delusions.  She informed provider that she is easily distractible, irritable, has visual hallucinations (noting that she sees dark shadows), and delusions surrounding her sisters being dead.  Provider also discussed patient Depakote level with her today and informed her that her levels are low.  She is agreeable to increasing Depakote to 250 mg BID mg to 500 mg BIS mg to help manage her mood.  She is also agreeable to discontinuing Seroquel (noting that it is to sedating) and starting Abilify 5 mg.  She was instructed to take 50 mg of Seroquel instead of 100 for a week and then discontinue it after a week [place]ough to starting Abilify.  She will follow-up with provider on 05/18/2020.  If patient's mood is still unstable provider discussed increasing Lamictal to 75 mg for 2 weeks and then increasing the dose to 100 mg 2 weeks later to help stabilize mood.  No other concerns noted at this time.

## 2020-05-15 ENCOUNTER — Ambulatory Visit (HOSPITAL_COMMUNITY): Payer: No Payment, Other | Admitting: Psychiatry

## 2020-05-18 ENCOUNTER — Telehealth (INDEPENDENT_AMBULATORY_CARE_PROVIDER_SITE_OTHER): Payer: No Payment, Other | Admitting: Psychiatry

## 2020-05-18 ENCOUNTER — Other Ambulatory Visit: Payer: Self-pay

## 2020-05-18 ENCOUNTER — Other Ambulatory Visit (HOSPITAL_COMMUNITY): Payer: Self-pay | Admitting: Psychiatry

## 2020-05-18 ENCOUNTER — Encounter (HOSPITAL_COMMUNITY): Payer: Self-pay | Admitting: Psychiatry

## 2020-05-18 DIAGNOSIS — F3160 Bipolar disorder, current episode mixed, unspecified: Secondary | ICD-10-CM | POA: Diagnosis not present

## 2020-05-18 DIAGNOSIS — F4310 Post-traumatic stress disorder, unspecified: Secondary | ICD-10-CM

## 2020-05-18 DIAGNOSIS — F411 Generalized anxiety disorder: Secondary | ICD-10-CM | POA: Diagnosis not present

## 2020-05-18 DIAGNOSIS — F322 Major depressive disorder, single episode, severe without psychotic features: Secondary | ICD-10-CM | POA: Diagnosis not present

## 2020-05-18 MED ORDER — DIVALPROEX SODIUM ER 500 MG PO TB24: 500.0000 mg | ORAL_TABLET | Freq: Two times a day (BID) | ORAL | 2 refills | Status: DC

## 2020-05-18 MED ORDER — MIRTAZAPINE 30 MG PO TABS: 30.0000 mg | ORAL_TABLET | Freq: Every day | ORAL | 2 refills | Status: DC

## 2020-05-18 MED ORDER — ARIPIPRAZOLE 5 MG PO TABS: 5.0000 mg | ORAL_TABLET | Freq: Every day | ORAL | 2 refills | Status: DC

## 2020-05-18 MED ORDER — PRAZOSIN HCL 1 MG PO CAPS: 1.0000 mg | ORAL_CAPSULE | Freq: Every day | ORAL | 2 refills | Status: DC

## 2020-05-18 MED ORDER — HYDROXYZINE HCL 25 MG PO TABS: 25.0000 mg | ORAL_TABLET | Freq: Three times a day (TID) | ORAL | 2 refills | Status: DC | PRN

## 2020-05-18 MED ORDER — LAMOTRIGINE 25 MG PO TABS: 50.0000 mg | ORAL_TABLET | Freq: Every day | ORAL | 2 refills | Status: DC

## 2020-05-18 MED ORDER — TRAZODONE HCL 50 MG PO TABS: 50.0000 mg | ORAL_TABLET | Freq: Every evening | ORAL | 2 refills | Status: DC | PRN

## 2020-05-18 MED FILL — MIRTAZAPINE 30 MG TABLET: 30 | 30 days supply | Qty: 30 | Fill #0

## 2020-05-18 MED FILL — hydrOXYzine HCL 25 MG TABS: 25 | 30 days supply | Qty: 90 | Fill #0

## 2020-05-18 MED FILL — traZODone HCL 50 MG TABS: 50 | 30 days supply | Qty: 30 | Fill #0

## 2020-05-18 NOTE — Progress Notes (Signed)
BH MD/PA/NP OP Progress Note Virtual Visit via Video Note  I connected with Sara Pearson on 05/18/20 at  9:30 AM EST by a video enabled telemedicine application and verified that I am speaking with the correct person using two identifiers.  Location: Patient: Home Provider: Clinic   I discussed the limitations of evaluation and management by telemedicine and the availability of in person appointments. The patient expressed understanding and agreed to proceed.  I provided 30 minutes of non-face-to-face time during this encounter.    05/18/2020 9:53 AM Sara Pearson  MRN:  [id number]  Chief Complaint: "I feel like Im lifting out of the fog"  HPI: 34 year old female seen today for followup psychiatric evaluation.. She has a psychiatric history of Bipolar 1, anxiety, depression, and PTSD. She is currently managed on Depakote ER 500 mg twice daily,Abilify 5 mg daily, Lamictal 50 mg daily, prazosin 1 mg nightly, hydroxyzine 10 mg three times daily, and Remeron 15 mg nightly. Patient noted that she was taking Seroquel 25 mg nightly as well to assist with sleep and noted all her other medications were effective in managing her psychiatric conditions.   Today she is well-groomed, pleasant, cooperative, engaged in conversation, and maintained eye contact.  She informed provider that since starting Abilify her mood, depression, and anxiety has drastically improved.  She stated I feel like I am lifting out the fog and have a lease on life.  Provider conducted a GAD-7 and patient scored a 0, at last visit she scored an 18.  Provider also conducted a PHQ 9 and patient scored a 15, at last visit she scored a 23.  She denies SI/HI/VH, mania, or paranoia.  She also noted that she no longer has nightmares while on prazosin.  She informed provider that she was having issues sleeping and noted that she restarted taking 25 mg of Seroquel to help manage sleep.  Provider recommended that patient discontinue the  Seroquel and start trazodone 25 mg to 50 mg as needed for sleep.  Patient was agreeable to recommendation. Provider also increased mirtazapine 15 mg to 30 mg because patient endorsed poor appetite.  She noted that her mood is stable and reports that at this time she does not want to increase Lamictal at this time. She will continue all other medications as prescribed. She will follow up with therapy for counseling. No other concerns noted at this time.  Visit Diagnosis:    ICD-10-CM   1. Bipolar I disorder, most recent episode mixed (HCC)  F31.60 ARIPiprazole (ABILIFY) 5 MG tablet    divalproex (DEPAKOTE ER) 500 MG 24 hr tablet    lamoTRIgine (LAMICTAL) 25 MG tablet    traZODone (DESYREL) 50 MG tablet  2. GAD (generalized anxiety disorder)  F41.1 hydrOXYzine (ATARAX/VISTARIL) 25 MG tablet    mirtazapine (REMERON) 30 MG tablet    traZODone (DESYREL) 50 MG tablet  3. Severe major depression (HCC)  F32.2 mirtazapine (REMERON) 30 MG tablet    traZODone (DESYREL) 50 MG tablet  4. PTSD (post-traumatic stress disorder)  F43.10 prazosin (MINIPRESS) 1 MG capsule    Past Psychiatric History:  Bipolar 1, anxiety, depression, and PTSD.  Past Medical History:  Past Medical History:  Diagnosis Date  . Bipolar 1 disorder (HCC)   . Heel spur   . Hypertension   . Osteogenesis imperfecta   . PCOS (polycystic ovarian syndrome)    No past surgical history on file.  Family Psychiatric History: Unknown   Family History: No  family history on file.  Social History:  Social History   Socioeconomic History  . Marital status: Married    Spouse name: Not on file  . Number of children: Not on file  . Years of education: Not on file  . Highest education level: Not on file  Occupational History  . Not on file  Tobacco Use  . Smoking status: Never Smoker  . Smokeless tobacco: Never Used  Substance and Sexual Activity  . Alcohol use: Yes  . Drug use: Never  . Sexual activity: Not on file  Other  Topics Concern  . Not on file  Social History Narrative  . Not on file   Social Determinants of Health   Financial Resource Strain: High Risk  . Difficulty of Paying Living Expenses: Hard  Food Insecurity: Food Insecurity Present  . Worried About Programme researcher, broadcasting/film/video in the Last Year: Sometimes true  . Ran Out of Food in the Last Year: Sometimes true  Transportation Needs: No Transportation Needs  . Lack of Transportation (Medical): No  . Lack of Transportation (Non-Medical): No  Physical Activity: Sufficiently Active  . Days of Exercise per Week: 3 days  . Minutes of Exercise per Session: 60 min  Stress: Stress Concern Present  . Feeling of Stress : Very much  Social Connections: Socially Isolated  . Frequency of Communication with Friends and Family: Once a week  . Frequency of Social Gatherings with Friends and Family: Once a week  . Attends Religious Services: Never  . Active Member of Clubs or Organizations: No  . Attends Banker Meetings: Never  . Marital Status: Married    Allergies:  Allergies  Allergen Reactions  . Codeine   . Iodine   . Rocephin [Ceftriaxone Sodium In Dextrose]   . Zofran [Ondansetron Hcl]     Metabolic Disorder Labs: No results found for: HGBA1C, MPG No results found for: PROLACTIN No results found for: CHOL, TRIG, HDL, CHOLHDL, VLDL, LDLCALC No results found for: TSH  Therapeutic Level Labs: No results found for: LITHIUM Lab Results  Component Value Date   VALPROATE 27 (L) 05/04/2020   No components found for:  CBMZ  Current Medications: Current Outpatient Medications  Medication Sig Dispense Refill  . ARIPiprazole (ABILIFY) 5 MG tablet Take 1 tablet (5 mg total) by mouth daily. 30 tablet 2  . divalproex (DEPAKOTE ER) 500 MG 24 hr tablet Take 1 tablet (500 mg total) by mouth in the morning and at bedtime. 60 tablet 2  . hydrOXYzine (ATARAX/VISTARIL) 25 MG tablet Take 1 tablet (25 mg total) by mouth 3 (three) times daily  as needed. 90 tablet 2  . lamoTRIgine (LAMICTAL) 25 MG tablet Take 2 tablets (50 mg total) by mouth daily. 60 tablet 2  . mirtazapine (REMERON) 30 MG tablet Take 1 tablet (30 mg total) by mouth at bedtime. 30 tablet 2  . prazosin (MINIPRESS) 1 MG capsule Take 1 capsule (1 mg total) by mouth at bedtime. 30 capsule 2  . traZODone (DESYREL) 50 MG tablet Take 1 tablet (50 mg total) by mouth at bedtime as needed for sleep. 30 tablet 2   No current facility-administered medications for this visit.     Musculoskeletal: Strength & Muscle Tone: Unable to assess due to telehealth visit Gait & Station: Unable to assess due to telehealth visit Patient leans: N/A  Psychiatric Specialty Exam: Review of Systems  There were no vitals taken for this visit.There is no height or weight on file  to calculate BMI.  General Appearance: Well Groomed  Eye Contact:  Good  Speech:  Clear and Coherent and Normal Rate  Volume:  Normal  Mood:  Euthymic and However endorsed symptoms of depression   Affect:  Appropriate and Congruent  Thought Process:  Coherent, Goal Directed and Linear  Orientation:  Full (Time, Place, and Person)  Thought Content: WDL and Logical   Suicidal Thoughts:  No  Homicidal Thoughts:  No  Memory:  Immediate;   Good Recent;   Good Remote;   Good  Judgement:  Good  Insight:  Good  Psychomotor Activity:  Normal  Concentration:  Concentration: Good and Attention Span: Good  Recall:  Good  Fund of Knowledge: Good  Language: Good  Akathisia:  No  Handed:  Right  AIMS (if indicated): Not done  Assets:  Communication Skills Desire for Improvement Financial Resources/Insurance Housing Intimacy Social Support  ADL's:  Intact  Cognition: WNL  Sleep:  Fair   Screenings: AUDIT     Counselor from 04/16/2020 in [facility]  Alcohol Use Disorder Identification Test Final Score (AUDIT) 8    GAD-7     Video Visit from 05/18/2020 in [facility] Video Visit from 04/20/2020 in [facility]  Total GAD-7 Score 0 18    PHQ2-9     Video Visit from 05/18/2020 in [facility] Counselor from 04/16/2020 in [facility]  PHQ-2 Total Score 3 6  PHQ-9 Total Score 15 23       Assessment and Plan: Patient endorses poor sleep however reports that her other medications are effective in managing her psychiatric conditions.She informed provider that she restarted taking 25 mg of Seroquel to help manage sleep.  Provider recommended that patient discontinue the Seroquel and start trazodone 25 mg to 50 mg as needed for sleep.  Patient was agreeable to recommendation. Provider also increased mirtazapine 15 mg to 30 mg because patient endorsed poor appetite.  She noted that her mood is stable and reports that at this time she does not want to increase Lamictal at this time.  1. Bipolar I disorder, most recent episode mixed (HCC)  Continue- ARIPiprazole (ABILIFY) 5 MG tablet; Take 1 tablet (5 mg total) by mouth daily.  Dispense: 30 tablet; Refill: 2 Continue- divalproex (DEPAKOTE ER) 500 MG 24 hr tablet; Take 1 tablet (500 mg total) by mouth in the morning and at bedtime.  Dispense: 60 tablet; Refill: 2 Continue- lamoTRIgine (LAMICTAL) 25 MG tablet; Take 2 tablets (50 mg total) by mouth daily.  Dispense: 60 tablet; Refill: 2 Start- traZODone (DESYREL) 50 MG tablet; Take 1 tablet (50 mg total) by mouth at bedtime as needed for sleep.  Dispense: 30 tablet; Refill: 2  2. GAD (generalized anxiety disorder) Continue- hydrOXYzine (ATARAX/VISTARIL) 25 MG tablet; Take 1 tablet (25 mg total) by mouth 3 (three) times daily as needed.  Dispense: 90 tablet; Refill: 2 Increased -mirtazapine (REMERON) 30 MG tablet; Take 1 tablet (30 mg total) by mouth at bedtime.  Dispense: 30 tablet; Refill: 2 Start- traZODone (DESYREL) 50 MG tablet; Take 1 tablet (50 mg total) by  mouth at bedtime as needed for sleep.  Dispense: 30 tablet; Refill: 2  3. Severe major depression (HCC)  Increased- mirtazapine (REMERON) 30 MG tablet; Take 1 tablet (30 mg total) by mouth at bedtime.  Dispense: 30 tablet; Refill: 2 Start- traZODone (DESYREL) 50 MG tablet; Take 1 tablet (50 mg total)  by mouth at bedtime as needed for sleep.  Dispense: 30 tablet; Refill: 2  4. PTSD (post-traumatic stress disorder)  Continue- prazosin (MINIPRESS) 1 MG capsule; Take 1 capsule (1 mg total) by mouth at bedtime.  Dispense: 30 capsule; Refill: 2  Follow-up in 3 months Follow-up with therapy  Shanna CiscoBrittney E Gerard Cantara, NP 05/18/2020, 9:53 AM

## 2020-05-24 ENCOUNTER — Telehealth (HOSPITAL_COMMUNITY): Payer: Self-pay | Admitting: *Deleted

## 2020-05-24 ENCOUNTER — Other Ambulatory Visit (HOSPITAL_COMMUNITY): Payer: Self-pay | Admitting: Psychiatry

## 2020-05-24 DIAGNOSIS — F3160 Bipolar disorder, current episode mixed, unspecified: Secondary | ICD-10-CM

## 2020-05-24 MED ORDER — LAMOTRIGINE 25 MG PO TABS: 75.0000 mg | ORAL_TABLET | Freq: Every day | ORAL | 2 refills | Status: DC

## 2020-05-24 MED FILL — lamoTRIgine 25 MG TABS: 25 | 30 days supply | Qty: 90 | Fill #0

## 2020-05-24 NOTE — Telephone Encounter (Signed)
Patient informed Clinical research associate that she has been having increased tremors.  Provider informed patient that Depakote and Lamictal together may exacerbate tremors and other side effects.  Provider recommend discontinuing Depakote and increasing Lamictal 50 mg to 75 mg for 2 weeks and then increasing to 100 mg.  Patient agreeable to recommendation. Potential side effects of medication and risks vs benefits of treatment vs non-treatment were explained and discussed. All questions were answered. No other concerns noted at this time.

## 2020-05-24 NOTE — Telephone Encounter (Signed)
Opened in error, no further documentation required. 

## 2020-05-24 NOTE — Telephone Encounter (Signed)
Call from patient stating she is currently on Lamictal 50 mg and she is getting the shakes again like she had before. She is wondering if she should return to 75 mg. Will forward her question to provider.

## 2020-05-31 ENCOUNTER — Ambulatory Visit (INDEPENDENT_AMBULATORY_CARE_PROVIDER_SITE_OTHER): Payer: No Payment, Other | Admitting: Licensed Clinical Social Worker

## 2020-05-31 ENCOUNTER — Other Ambulatory Visit: Payer: Self-pay

## 2020-05-31 DIAGNOSIS — F3130 Bipolar disorder, current episode depressed, mild or moderate severity, unspecified: Secondary | ICD-10-CM

## 2020-05-31 DIAGNOSIS — F411 Generalized anxiety disorder: Secondary | ICD-10-CM

## 2020-05-31 NOTE — Progress Notes (Signed)
° °  THERAPIST PROGRESS NOTE  Session Time: 58  Therapist Response:    Subjective/Objective: Pt was alert and oriented x 5. She was dressed casually and presented neat and clear. Pt came in with a depressed mood/affect. Sara Pearson was cooperative and maintained good eye contact.   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 Over the past 6 weeks pt has been improving as evidence by PHQ-9 score. LCSW scored her on initial eval and pt score a 23, then Medication provider scored her at 15 1 week ago. LCSW re scored pt today and she scored an 11. Sara Pearson is overall happy with her medications and believes they are working well. She has been stressed with balancing work and taking care of her spouse full time.   Plan/Intervention: LCSW utilized medication, supportive, and life skills therapy as primary intervention. Tilia did 10 minutes of guided mediation in session which she reported relief from. LCSW and pt also discuss organization skills and possibly getting a planner. Sara Pearson was agreeable to the planner and will bring it for next session. This will help her breakdown her days into more manageable portions.    Assessment: Pt endorse symptoms for depression as sadness, hopelessness, irritability, tension and worry. Currently pt does meet criteria for bipolar disorder currently mixed. She will f/u with LCSW with plan and intervention listed above.  Participation Level: Active  Behavioral Response: Casual and Fairly GroomedAlertDepressed  Type of Therapy: Individual Therapy  Treatment Goals addressed: Diagnosis: bipolar depression   Interventions: CBT and Supportive  Summary: Sara Pearson is a 34 y.o. female who presents with Bipolar depression.   Suicidal/Homicidal: NAwithout intent/plan  Plan: Return again in  4 to 6 weeks.     Weber Cooks, LCSW 05/31/2020

## 2020-06-12 MED FILL — ARIPiprazole 5 MG TABS: 5 | 30 days supply | Qty: 30 | Fill #1

## 2020-06-20 ENCOUNTER — Other Ambulatory Visit: Payer: Self-pay

## 2020-06-20 ENCOUNTER — Ambulatory Visit (INDEPENDENT_AMBULATORY_CARE_PROVIDER_SITE_OTHER): Payer: No Payment, Other | Admitting: Licensed Clinical Social Worker

## 2020-06-20 DIAGNOSIS — F3130 Bipolar disorder, current episode depressed, mild or moderate severity, unspecified: Secondary | ICD-10-CM | POA: Diagnosis not present

## 2020-06-20 DIAGNOSIS — F411 Generalized anxiety disorder: Secondary | ICD-10-CM

## 2020-06-20 NOTE — Progress Notes (Signed)
   THERAPIST PROGRESS NOTE  Session Time: 38  Therapist Response:   Subjective/Objective:  Pt was alert and oriented x 5. She was dressed casually and engaged well in therapy session. Pt Presented today with an anxious mood/affect. Sara Pearson was cooperative and maintained good eye contact.   Pt primary stressor has been caregiver stress. She has been taking all her medication as prescribed. But she says as a side effect she has been very tired. Pt has reduced her trazadone to half a pill per night and takes it around 7am. She states that she still has been tossing and turning to get to sleep but does not want to take the full pill as it makes it hard to wake up in the morning. Sara Pearson was directed to speak to her medication provide [place] in 2 weeks if nothing changes. Sara Pearson states she will attempted to take a full pill at 6pm instead of 7pm to try and get the medication to wear off sooner before she need to wake up. Pt suggested this without insight or direction from LCSW.   Pt has been utilizing coping strategies for her organization for using a planner. She states the primary barrier is getting use to writing things into it. LCSW suggested picking a day for 1 hour to write down what she believes is needed/scheduled for the week. This is aimed to get her into a pattern of writing things down. Sara Pearson has also been struggling with journaling. LCSW provided pt with journaling prompts to use 2 to 3 per week. Pt new goal is to gradually increase her exercise. LCSW provided pt with goal of starting out with 1 to 2 workout per week for 20 minutes. Sara Pearson was agreeable to plan moving forward.    Assessment/Plan: Sara Pearson endorses symptoms for depression as sleepiness, worthlessness, lack of motivation, isolation, and insomnia. Currently she does meet criteria for bipolar 1 disorder depressed. She continues to take all her medications as prescribed minus her trazadone which she cuts in half. Sara Pearson will f/u with LCSW in 3 weeks.  Plan is listed above.  Participation Level: Active  Behavioral Response: Casual and Fairly GroomedAlertAnxious  Type of Therapy: Individual Therapy   Treatment Goals addressed: Diagnosis: Depression   Interventions: CBT and Supportive  Summary: Sara Pearson is a 35 y.o. female who presents with Bipolar depression.   Suicidal/Homicidal: Nowithout intent/plan  Plan: Return again in Next available      Weber Cooks, LCSW 06/20/2020

## 2020-06-28 MED FILL — traZODone HCL 50 MG TABS: 50 | 30 days supply | Qty: 30 | Fill #1

## 2020-06-28 MED FILL — lamoTRIgine 25 MG TABS: 25 | 30 days supply | Qty: 90 | Fill #1

## 2020-06-28 MED FILL — MIRTAZAPINE 30 MG TABLET: 30 | 30 days supply | Qty: 30 | Fill #1

## 2020-07-03 ENCOUNTER — Other Ambulatory Visit: Payer: Self-pay

## 2020-07-03 ENCOUNTER — Ambulatory Visit (INDEPENDENT_AMBULATORY_CARE_PROVIDER_SITE_OTHER): Payer: No Payment, Other | Admitting: Licensed Clinical Social Worker

## 2020-07-03 DIAGNOSIS — F3130 Bipolar disorder, current episode depressed, mild or moderate severity, unspecified: Secondary | ICD-10-CM

## 2020-07-03 DIAGNOSIS — F411 Generalized anxiety disorder: Secondary | ICD-10-CM

## 2020-07-03 NOTE — Progress Notes (Signed)
   THERAPIST PROGRESS NOTE  Session Time: 72   Virtual Visit via Telephone Note  I connected with Patte Winkel Fontanella on 07/03/20 at 11:00 AM EST by telephone and verified that I am speaking with the correct person using two identifiers.  Location: Patient: Pt work in [employer]  Provider: LCSW home office    I discussed the limitations, risks, security and privacy concerns of performing an evaluation and management service by telephone and the availability of in person appointments. I also discussed with the patient that there may be a patient responsible charge related to this service. The patient expressed understanding and agreed to proceed.    Therapist Response:    Subjective/Objective: Pt was alert and oriented x 5. She was dressed casually and presented neat and clean for grooming. Pt presented today with euthymic mood/affect. She was cooperative and maintained good eye contact.   Pt reports primary stressor is home life. She handles a 40 hour per week job and comes home to take care of her disabled spouse. She states that most days she feels like she is in the movie "[place] [place] [employer]" living her life on repeat. Work, home, clean, and then repeat. Antasia reports that intervention/coping skills learned in therapy have been working. She is currently Journaling 3 x weekly for about 1 hour and she is utilizing her planner.   Intervention/Plan: LCSW utilized supportive and CB therapy. Pt will continue to journal 3 x weekly as she reports the most success with it. She will also continue to utilize her planner. LCSW suggested she utilize it as more of a guide more than a must. With regards to the planner pt to add in 1 hour 3 x weekly for herself as "free time". This time will be designed to be directed towards what she wants to do vs what she needs to do.    Assessment/Plan: Pt reports improved symptoms for depression and anxiety. Tension & worry are still present in her home life. But  depression and anxiety have decreased in her work life. She continues to take her medications for bipolar disorder and GAD as directed and states they have been helping. Pt will f/u with LCSW in 4 weeks in person.     I discussed the assessment and treatment plan with the patient. The patient was provided an opportunity to ask questions and all were answered. The patient agreed with the plan and demonstrated an understanding of the instructions.   The patient was advised to call back or seek an in-person evaluation if the symptoms worsen or if the condition fails to improve as anticipated.  I provided 45 minutes of non-face-to-face time during this encounter.   Weber Cooks, LCSW   Participation Level: Active  Behavioral Response: CasualAlertNA and Euthymic  Type of Therapy: Individual Therapy  Treatment Goals addressed: Diagnosis: bipolar deppressed and GAD   Interventions: CBT and Supportive  Summary: Hillery Zachman Bose is a 35 y.o. female who presents with bipolar depression and GAD.   Suicidal/Homicidal: Nowithout intent/plan    Plan: Return again in  weeks.     Weber Cooks, LCSW 07/03/2020

## 2020-07-11 ENCOUNTER — Telehealth (HOSPITAL_COMMUNITY): Payer: Self-pay | Admitting: *Deleted

## 2020-07-11 NOTE — Telephone Encounter (Signed)
VM from patient stating she was still having difficult concentrating and wanted to know if she could increase her Lamictal. Will forward concern to her provider.

## 2020-07-12 ENCOUNTER — Other Ambulatory Visit (HOSPITAL_COMMUNITY): Payer: Self-pay | Admitting: Psychiatry

## 2020-07-12 DIAGNOSIS — F3160 Bipolar disorder, current episode mixed, unspecified: Secondary | ICD-10-CM

## 2020-07-12 MED ORDER — LAMOTRIGINE 100 MG PO TABS: 100.0000 mg | ORAL_TABLET | Freq: Every day | ORAL | 2 refills | Status: DC

## 2020-07-12 MED FILL — lamoTRIgine 100 MG TABS: 100 | 30 days supply | Qty: 30 | Fill #0

## 2020-07-12 MED FILL — ARIPiprazole 5 MG TABS: 5 | 30 days supply | Qty: 30 | Fill #2

## 2020-07-12 NOTE — Telephone Encounter (Signed)
Patient informed Clinical research associate that she has been more irritable, distractible, having racing thoughts, and fluctuations in mood which is causing her to have poor concentration. She is agreeable to increase Lamictal 75 mg to 100 mg to help manage mood. She also notes that recently her hand, eye, and face have been moving abnormally. She notes that when she focuses she is able to stop theses movements. Provider informed patient that she needed to be see to assess further. She endorsed understanding and notes that she will come in the office for further assessment. No other concerns noted at this time.

## 2020-07-13 MED FILL — PRAZOSIN 1 MG CAPSULE: 1 | 30 days supply | Qty: 30 | Fill #0

## 2020-07-25 ENCOUNTER — Ambulatory Visit: Payer: Medicaid Other | Admitting: Family Medicine

## 2020-07-30 ENCOUNTER — Other Ambulatory Visit: Payer: Self-pay

## 2020-07-30 ENCOUNTER — Ambulatory Visit (INDEPENDENT_AMBULATORY_CARE_PROVIDER_SITE_OTHER): Payer: No Payment, Other | Admitting: Licensed Clinical Social Worker

## 2020-07-30 DIAGNOSIS — F411 Generalized anxiety disorder: Secondary | ICD-10-CM

## 2020-07-30 DIAGNOSIS — F3160 Bipolar disorder, current episode mixed, unspecified: Secondary | ICD-10-CM
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
## 2020-08-03 MED FILL — traZODone HCL 50 MG TABS: 50 | 30 days supply | Qty: 30 | Fill #2

## 2020-08-03 MED FILL — MIRTAZAPINE 30 MG TABLET: 30 | 30 days supply | Qty: 30 | Fill #2

## 2020-08-07 MED FILL — lamoTRIgine 100 MG TABS: 100 | 30 days supply | Qty: 30 | Fill #1

## 2020-08-08 ENCOUNTER — Other Ambulatory Visit (HOSPITAL_COMMUNITY): Payer: Self-pay | Admitting: Psychiatry

## 2020-08-08 ENCOUNTER — Telehealth (HOSPITAL_COMMUNITY): Payer: Self-pay | Admitting: *Deleted

## 2020-08-08 DIAGNOSIS — F3160 Bipolar disorder, current episode mixed, unspecified: Secondary | ICD-10-CM

## 2020-08-08 MED ORDER — ARIPIPRAZOLE (SENSOR) 10 MG PO TABS: 10.0000 mg | ORAL_TABLET | Freq: Every day | ORAL | 2 refills | Status: DC

## 2020-08-08 MED FILL — ARIPIPRAZOLE 10 MG TABS: 10 | 30 days supply | Qty: 30 | Fill #0

## 2020-08-08 NOTE — Telephone Encounter (Signed)
PATIENT LVM STATING THAT SHE IS HAVING ISSUES CONCENTRATING & DISSOCIATIVE THOUGHTS. CALLED PATIENT TO GET MORE DETAIL ABOUT HER ISSUE  & TO INFORM THAT ANY MEDICATION CHANGES DOES REQUIRE A  APPOINTMENT WITH THE PROVIDER FOR FURTHER EVALUATION.Marland Kitchen  PATIENT HAS A UPCOMING APPOINTMENT ON NEXT WEEK  08-17-20. PATIENT STATED SHE'S HAVING A HARD TIME STAYING FOCUSED & CONCENTRATING. INFORMED THAT MESSAGE WOULD BE PASSED ALONG TO PROVIDER.

## 2020-08-08 NOTE — Telephone Encounter (Signed)
Patient notes that she feels like she is disassociating and having negative intrusive thoughts.  She notes that she hears voices that tell her to run into traffic, that she is worthless, and to run a red light.  She informed provider that she is not suicidal because she feels like her life is worth something.  She however notes that she feels like she is watching herself do task, is a passenger instead of the driver of her life, and feels numb.  She informed provider that she feels irritable, distractible, and impulsively shops (noting that she purchases crafts and reports that recently she spent over $75 and the dollar store).  She informed provider that she feels paranoid at times and notes that she believes that something bad may occur.  She notes that she finds that her mood fluctuates most when she is on her cycle.  Today she is agreeable to increasing Abilify of 5 mg to 10 mg to help stabilize mood and symptoms of hypomania.

## 2020-08-17 ENCOUNTER — Other Ambulatory Visit (HOSPITAL_COMMUNITY): Payer: Self-pay | Admitting: Psychiatry

## 2020-08-17 ENCOUNTER — Encounter (HOSPITAL_COMMUNITY): Payer: Self-pay | Admitting: Psychiatry

## 2020-08-17 ENCOUNTER — Other Ambulatory Visit: Payer: Self-pay

## 2020-08-17 ENCOUNTER — Telehealth (INDEPENDENT_AMBULATORY_CARE_PROVIDER_SITE_OTHER): Payer: No Payment, Other | Admitting: Psychiatry

## 2020-08-17 DIAGNOSIS — F4310 Post-traumatic stress disorder, unspecified: Secondary | ICD-10-CM

## 2020-08-17 DIAGNOSIS — F411 Generalized anxiety disorder: Secondary | ICD-10-CM | POA: Diagnosis not present

## 2020-08-17 DIAGNOSIS — F3130 Bipolar disorder, current episode depressed, mild or moderate severity, unspecified: Secondary | ICD-10-CM

## 2020-08-17 MED ORDER — LAMOTRIGINE 100 MG PO TABS: 100.0000 mg | ORAL_TABLET | Freq: Every day | ORAL | 2 refills | Status: DC

## 2020-08-17 MED ORDER — HYDROXYZINE HCL 50 MG PO TABS: 50.0000 mg | ORAL_TABLET | Freq: Three times a day (TID) | ORAL | 2 refills | Status: DC | PRN

## 2020-08-17 MED ORDER — BUPROPION HCL ER (XL) 150 MG PO TB24: 150.0000 mg | ORAL_TABLET | ORAL | 2 refills | Status: DC

## 2020-08-17 MED ORDER — TRAZODONE HCL 100 MG PO TABS: 100.0000 mg | ORAL_TABLET | Freq: Every evening | ORAL | 2 refills | Status: DC | PRN

## 2020-08-17 MED ORDER — ARIPIPRAZOLE (SENSOR) 10 MG PO TABS: 10.0000 mg | ORAL_TABLET | Freq: Every day | ORAL | 2 refills | Status: DC

## 2020-08-17 MED ORDER — PRAZOSIN HCL 1 MG PO CAPS: 1.0000 mg | ORAL_CAPSULE | Freq: Every day | ORAL | 2 refills | Status: DC

## 2020-08-17 MED FILL — PRAZOSIN 1 MG CAPSULE: 1 | 30 days supply | Qty: 30 | Fill #0

## 2020-08-17 MED FILL — hydrOXYzine HCL 50 MG TABS: 50 | 30 days supply | Qty: 90 | Fill #0

## 2020-08-17 MED FILL — BUPROPION HCL XL 150 MG TAB: 150 | 30 days supply | Qty: 30 | Fill #0

## 2020-08-17 MED FILL — TRAZODONE HCL 100 MG TABS: 100 | 30 days supply | Qty: 30 | Fill #0

## 2020-08-17 NOTE — Progress Notes (Signed)
BH MD/PA/NP OP Progress Note Virtual Visit via Video Note  I connected with Sara Pearson on 08/17/20 at  9:30 AM EST by a video enabled telemedicine application and verified that I am speaking with the correct person using two identifiers.  Location: Patient: Work Provider: Clinic   I discussed the limitations of evaluation and management by telemedicine and the availability of in person appointments. The patient expressed understanding and agreed to proceed.  I provided 30 minutes of non-face-to-face time during this encounter.    08/17/2020 9:52 AM Sara Pearson  MRN:  [id number]  Chief Complaint: "I have been really depressed and I dislike the weightgain"  HPI: 35 year old female seen today for followup psychiatric evaluation. She has a psychiatric history of Bipolar 1, anxiety, depression, and PTSD. She is currently managed on Trazodone 50 mg nightly, Abilify 10 mg daily, Lamictal 100 mg daily, prazosin 1 mg nightly, hydroxyzine 25 mg three times daily, and Remeron 30 mg nightly. She notes her medications are somewhat effective in managing her psychiatric conditions.   Today she is well-groomed, pleasant, cooperative, engaged in conversation, and maintained eye contact.  She informed provider that since her last visit she has been more anxious, depressed, and irritable. She notes that she constantly feels nervous, anxious, and on edge. She informed provider that she fears something awful will happen but is unsure of what that something is.  Provider conducted a GAD-7 and patient scored a 21, at her last visit she scored 0. Provider also conducted a PHQ 9 and patient scored a  18, at last visit she scored a 15.  She notes that since increasing Abilify her mood does not fluctuate as much. She notes however that she can be inpatient, irritable (noting that noise or the littlest thing makes her mad), and distractible. Today she endorses passive SI, noting that at times she wishes she were not  here. She however notes that she does not want to hurt herself and SI/HI/VAH or paranoia.    Patient notes that her sleep has drasticly improved. She notes that she sleeps 8 or more hours nightly. She however informed provider that she dislikes the weight she has gained while on Mirtazapine and she wants to discontinue it. She notes that she has been out of prazosin for a week and has been having more nightmares.   Patient notes that Wellbutrin was effective in managing her depression in the past. Today she is agreeable to start Wellbutrin XL 150 mg daily to help manage depression. She notes that she found Buspar ineffective (noting it was a sugar pill). She is agreeable to increase hydroxyzine 25  Mg three times daily to 50 mg three times daily to help manage anxiety. She will discontinue Mirtazipine due to her disliking her weight gain. She is agreeable to increase Trazodone 50 mg to 100 mg to help manage sleep. She will continue all other medications as prescribed. She will follow up with therapy for counseling. No other concerns noted at this time.  Visit Diagnosis:    ICD-10-CM   1. Bipolar I disorder, most recent episode depressed (HCC)  F31.30 ARIPiprazole, sensor, 10 MG TABS    lamoTRIgine (LAMICTAL) 100 MG tablet    traZODone (DESYREL) 100 MG tablet    buPROPion (WELLBUTRIN XL) 150 MG 24 hr tablet  2. GAD (generalized anxiety disorder)  F41.1 hydrOXYzine (ATARAX/VISTARIL) 50 MG tablet    traZODone (DESYREL) 100 MG tablet  3. PTSD (post-traumatic stress disorder)  F43.10 prazosin (  MINIPRESS) 1 MG capsule    Past Psychiatric History:  Bipolar 1, anxiety, depression, and PTSD.  Past Medical History:  Past Medical History:  Diagnosis Date  . Bipolar 1 disorder (HCC)   . Heel spur   . Hypertension   . Osteogenesis imperfecta   . PCOS (polycystic ovarian syndrome)    History reviewed. No pertinent surgical history.  Family Psychiatric History: Unknown   Family History: History  reviewed. No pertinent family history.  Social History:  Social History   Socioeconomic History  . Marital status: Married    Spouse name: Not on file  . Number of children: Not on file  . Years of education: Not on file  . Highest education level: Not on file  Occupational History  . Not on file  Tobacco Use  . Smoking status: Never Smoker  . Smokeless tobacco: Never Used  Substance and Sexual Activity  . Alcohol use: Yes  . Drug use: Never  . Sexual activity: Not on file  Other Topics Concern  . Not on file  Social History Narrative  . Not on file   Social Determinants of Health   Financial Resource Strain: High Risk  . Difficulty of Paying Living Expenses: Hard  Food Insecurity: Food Insecurity Present  . Worried About Programme researcher, broadcasting/film/videounning Out of Food in the Last Year: Sometimes true  . Ran Out of Food in the Last Year: Sometimes true  Transportation Needs: No Transportation Needs  . Lack of Transportation (Medical): No  . Lack of Transportation (Non-Medical): No  Physical Activity: Sufficiently Active  . Days of Exercise per Week: 3 days  . Minutes of Exercise per Session: 60 min  Stress: Stress Concern Present  . Feeling of Stress : Very much  Social Connections: Socially Isolated  . Frequency of Communication with Friends and Family: Once a week  . Frequency of Social Gatherings with Friends and Family: Once a week  . Attends Religious Services: Never  . Active Member of Clubs or Organizations: No  . Attends BankerClub or Organization Meetings: Never  . Marital Status: Married    Allergies:  Allergies  Allergen Reactions  . Codeine   . Iodine   . Rocephin [Ceftriaxone Sodium In Dextrose]   . Zofran [Ondansetron Hcl]     Metabolic Disorder Labs: No results found for: HGBA1C, MPG No results found for: PROLACTIN No results found for: CHOL, TRIG, HDL, CHOLHDL, VLDL, LDLCALC No results found for: TSH  Therapeutic Level Labs: No results found for: LITHIUM Lab Results   Component Value Date   VALPROATE 27 (L) 05/04/2020   No components found for:  CBMZ  Current Medications: Current Outpatient Medications  Medication Sig Dispense Refill  . buPROPion (WELLBUTRIN XL) 150 MG 24 hr tablet Take 1 tablet (150 mg total) by mouth every morning. 30 tablet 2  . ARIPiprazole, sensor, 10 MG TABS Take 10 mg by mouth daily. 30 tablet 2  . hydrOXYzine (ATARAX/VISTARIL) 50 MG tablet Take 1 tablet (50 mg total) by mouth 3 (three) times daily as needed. 90 tablet 2  . lamoTRIgine (LAMICTAL) 100 MG tablet Take 1 tablet (100 mg total) by mouth daily. 30 tablet 2  . prazosin (MINIPRESS) 1 MG capsule Take 1 capsule (1 mg total) by mouth at bedtime. 30 capsule 2  . traZODone (DESYREL) 100 MG tablet Take 1 tablet (100 mg total) by mouth at bedtime as needed for sleep. 30 tablet 2   No current facility-administered medications for this visit.  Musculoskeletal: Strength & Muscle Tone: Unable to assess due to telehealth visit Gait & Station: Unable to assess due to telehealth visit Patient leans: N/A  Psychiatric Specialty Exam: Review of Systems  There were no vitals taken for this visit.There is no height or weight on file to calculate BMI.  General Appearance: Well Groomed  Eye Contact:  Good  Speech:  Clear and Coherent and Normal Rate  Volume:  Normal  Mood:  Anxious, Depressed and Irritable  Affect:  Appropriate and Congruent  Thought Process:  Coherent, Goal Directed and Linear  Orientation:  Full (Time, Place, and Person)  Thought Content: WDL and Logical   Suicidal Thoughts:  No  Homicidal Thoughts:  No  Memory:  Immediate;   Good Recent;   Good Remote;   Good  Judgement:  Good  Insight:  Good  Psychomotor Activity:  Normal  Concentration:  Concentration: Good and Attention Span: Good  Recall:  Good  Fund of Knowledge: Good  Language: Good  Akathisia:  No  Handed:  Right  AIMS (if indicated): Not done  Assets:  Communication Skills Desire for  Improvement Financial Resources/Insurance Housing Intimacy Social Support  ADL's:  Intact  Cognition: WNL  Sleep:  Good   Screenings: AUDIT   Advertising copywriter from 04/16/2020 in [facility]  Alcohol Use Disorder Identification Test Final Score (AUDIT) 8    GAD-7   Flowsheet Row Video Visit from 08/17/2020 in [facility] Video Visit from 05/18/2020 in [facility] Video Visit from 04/20/2020 in [facility]  Total GAD-7 Score 21 0 18    PHQ2-9   Flowsheet Row Video Visit from 08/17/2020 in [facility] Counselor from 05/31/2020 in [facility] Video Visit from 05/18/2020 in [facility] Counselor from 04/16/2020 in [facility]  PHQ-2 Total Score 5 2 3 6   PHQ-9 Total Score 18 11 15 23     Flowsheet Row Video Visit from 08/17/2020 in [facility] Counselor from 04/16/2020 in [facility]  C-SSRS RISK CATEGORY Error: Question 2 not populated Low Risk       Assessment and Plan: Patient endorses symptoms of anxiety and depression.  Today she is agreeable to start Wellbutrin XL 150 mg daily to help manage depression. She notes that she found Buspar ineffective in managing anxiety (noting it was a sugar pill). She is agreeable to increase hydroxyzine 25  Mg three times daily to 50 mg three times daily to help manage anxiety. She will discontinue Mirtazipine due to her disliking her weight gain. She is agreeable to increase Trazodone 50 mg to 100 mg to help manage sleep. She will continue all other medications as prescribed.  1. GAD (generalized anxiety disorder)  Increased- hydrOXYzine (ATARAX/VISTARIL) 50 MG tablet; Take 1 tablet (50 mg total) by mouth 3 (three) times daily as needed.  Dispense: 90 tablet; Refill:  2 Increased- traZODone (DESYREL) 100 MG tablet; Take 1 tablet (100 mg total) by mouth at bedtime as needed for sleep.  Dispense: 30 tablet; Refill: 2  2. PTSD (post-traumatic stress disorder)  Continue- prazosin (MINIPRESS) 1 MG capsule; Take 1 capsule (1 mg total) by mouth at bedtime.  Dispense: 30 capsule; Refill: 2  3. Bipolar I disorder, most recent episode depressed (HCC)  Continue- ARIPiprazole, sensor, 10 MG TABS; Take 10 mg by mouth daily.  Dispense: 30 tablet; Refill:  2 Continue- lamoTRIgine (LAMICTAL) 100 MG tablet; Take 1 tablet (100 mg total) by mouth daily.  Dispense: 30 tablet; Refill: 2 Increased- traZODone (DESYREL) 100 MG tablet; Take 1 tablet (100 mg total) by mouth at bedtime as needed for sleep.  Dispense: 30 tablet; Refill: 2 Start- buPROPion (WELLBUTRIN XL) 150 MG 24 hr tablet; Take 1 tablet (150 mg total) by mouth every morning.  Dispense: 30 tablet; Refill: 2    Follow-up in 3 months Follow-up with therapy  Shanna Cisco, NP 08/17/2020, 9:52 AM

## 2020-08-20 ENCOUNTER — Ambulatory Visit (INDEPENDENT_AMBULATORY_CARE_PROVIDER_SITE_OTHER): Payer: No Payment, Other | Admitting: Licensed Clinical Social Worker

## 2020-08-20 ENCOUNTER — Other Ambulatory Visit: Payer: Self-pay

## 2020-08-20 DIAGNOSIS — F3130 Bipolar disorder, current episode depressed, mild or moderate severity, unspecified: Secondary | ICD-10-CM

## 2020-08-20 NOTE — Progress Notes (Signed)
   THERAPIST PROGRESS NOTE  Session Time: 48  Participation Level: Active  Behavioral Response: CasualAlertAnxious  Type of Therapy: Individual Therapy  Treatment Goals addressed: Diagnosis: Bipolar 1 depression   Interventions: CBT and Supportive  Summary: Sara Pearson is a 35 y.o. female who presents with bipolar 1  .   Suicidal/Homicidal: NAwithout intent/plan  Therapist Response:    Subjective/Objective:  Pt was alert and oriented x 5. She was dressed casually and engaged well. Sara Pearson presented with anxious mood/affect. Pt was cooperative and maintained good eye contact.   Pt primary stressor is medications. She states that she just started Wellbutrin which has made her drowsy. Pt states that she is going to give 2 full weeks then f/u with RN if symptoms are still present. Sara Pearson reports she  has not been motivated to do the things discussed in therapy such as journal and walk. Sara Pearson does report that she wrote the letter discussed in last session to her mom who has passed away. This was designed to provide closure for pt and to help express her feeling towards her mother. Sara Pearson reported relief from the exercise.    Assessment/Plan: Pt endorse fatigue, over sleeping, irritability, tension, worry, restlessness. Currently pt does meet criteria for bipolar depression. She is taking her medications as prescribed. Plan walk 1 x weekly and journal 1 x weekly. Currently pt denies that she has done the past two weeks.   Plan: Return again in  weeks.      Weber Cooks, LCSW 08/20/2020

## 2020-09-06 ENCOUNTER — Ambulatory Visit (HOSPITAL_COMMUNITY): Payer: No Payment, Other | Admitting: Licensed Clinical Social Worker

## 2020-09-10 MED FILL — ARIPIPRAZOLE 10 MG TABS: 10 | 30 days supply | Qty: 30 | Fill #1

## 2020-09-13 MED FILL — PRAZOSIN 1 MG CAPSULE: 1 | 30 days supply | Qty: 30 | Fill #1

## 2020-09-13 MED FILL — lamoTRIgine 100 MG TABS: 100 | 30 days supply | Qty: 30 | Fill #2

## 2020-09-13 MED FILL — TRAZODONE HCL 100 MG TABS: 100 | 30 days supply | Qty: 30 | Fill #1

## 2020-09-13 MED FILL — BUPROPION HCL XL 150 MG TAB: 150 | 30 days supply | Qty: 30 | Fill #1

## 2020-09-15 ENCOUNTER — Other Ambulatory Visit: Payer: Self-pay

## 2020-09-17 ENCOUNTER — Other Ambulatory Visit: Payer: Self-pay

## 2020-09-17 ENCOUNTER — Ambulatory Visit (INDEPENDENT_AMBULATORY_CARE_PROVIDER_SITE_OTHER): Payer: No Payment, Other | Admitting: Licensed Clinical Social Worker

## 2020-09-17 DIAGNOSIS — F3130 Bipolar disorder, current episode depressed, mild or moderate severity, unspecified: Secondary | ICD-10-CM | POA: Diagnosis not present

## 2020-09-17 DIAGNOSIS — F411 Generalized anxiety disorder: Secondary | ICD-10-CM

## 2020-09-17 NOTE — Progress Notes (Signed)
   THERAPIST PROGRESS NOTE  Session Time: 62   Participation Level: Active  Behavioral Response: CasualAlertAnxious and Depressed  Type of Therapy: Individual Therapy  Treatment Goals addressed: Diagnosis: depression and anxiety   Interventions: CBT and Supportive  Summary: Sara Pearson is a 35 y.o. female who presents with Major depression and anxiety.   Suicidal/Homicidal: NAwithout intent/plan  Therapist Response:    Subjective/Objective:  Pt was alert and oriented x 5. She was dressed casually and engaged well in therapy session. Sara Pearson presented with depressed mood/affect. She was cooperative and maintained good eye contact.    Pt Primary stressor is work and family conflict. She states that she has trouble setting boundaries at work. Her boss will call her after hours to explain mistakes she has made. LCSW spoke with pt about setting clear boundaries by using the "sandwich effect" Start out with a positive, add the negative and then another positive. For example, "I really appreciate that you are willing to give me constructive feedback, But I do work from 8 to 5. Do you think we could set aside time during the day to discuss this feedback further?". Sara Pearson was agreeable to try this. Before next session in 6 weeks.  She reports family conflict. Her sister is currently at a detox facility and her father is sick with heart problems. Goal to talk with them each 1 x per week for 10 to 20 minutes. Pt previous goals were to journal weekly which she reports she is doing 1 x on weekends. She also is supposed to be taking walk 3 x per week. Currently pt is taking walks in her apartment complex to take the trash out 3 x weekly. New goal walks 3 x weekly for 10 minutes.    Assessment: Pt did improve on her PHQ-9 and GAD-7. Previous score for PHQ-9 18 currently at 14. Previous score for GAD-7 was 21, current score is 10. She endorses symptoms for fatigues, insomnia, tension, worry, and hopelessness.  She does meet criteria for bipolar depression and GAD. Sara Pearson is taking her medications as prescribed.   Plan: Return again in 4 weeks.    Weber Cooks, LCSW 09/17/2020

## 2020-10-11 MED FILL — Prazosin HCl Cap 1 MG: ORAL | 30 days supply | Qty: 30 | Fill #0 | Status: AC

## 2020-10-11 MED FILL — Trazodone HCl Tab 100 MG: ORAL | 30 days supply | Qty: 30 | Fill #0 | Status: AC

## 2020-10-11 MED FILL — Bupropion HCl Tab ER 24HR 150 MG: ORAL | 30 days supply | Qty: 30 | Fill #0 | Status: AC

## 2020-10-11 MED FILL — Aripiprazole Tab 10 MG: ORAL | 30 days supply | Qty: 30 | Fill #0 | Status: AC

## 2020-10-12 ENCOUNTER — Other Ambulatory Visit: Payer: Self-pay

## 2020-10-12 MED FILL — Lamotrigine Tab 100 MG: ORAL | 30 days supply | Qty: 30 | Fill #0 | Status: CN

## 2020-10-15 ENCOUNTER — Other Ambulatory Visit: Payer: Self-pay

## 2020-10-22 ENCOUNTER — Ambulatory Visit: Payer: Self-pay | Attending: Family Medicine | Admitting: Family Medicine

## 2020-10-22 ENCOUNTER — Other Ambulatory Visit: Payer: Self-pay

## 2020-10-22 ENCOUNTER — Encounter: Payer: Self-pay | Admitting: Family Medicine

## 2020-10-22 VITALS — BP 141/91 | HR 100 | Ht 66.0 in | Wt 279.0 lb

## 2020-10-22 DIAGNOSIS — Z131 Encounter for screening for diabetes mellitus: Secondary | ICD-10-CM

## 2020-10-22 DIAGNOSIS — N809 Endometriosis, unspecified: Secondary | ICD-10-CM

## 2020-10-22 DIAGNOSIS — I10 Essential (primary) hypertension: Secondary | ICD-10-CM | POA: Insufficient documentation

## 2020-10-22 DIAGNOSIS — R102 Pelvic and perineal pain: Secondary | ICD-10-CM

## 2020-10-22 DIAGNOSIS — R5383 Other fatigue: Secondary | ICD-10-CM

## 2020-10-22 DIAGNOSIS — E282 Polycystic ovarian syndrome: Secondary | ICD-10-CM | POA: Insufficient documentation

## 2020-10-22 LAB — POCT URINALYSIS DIP (CLINITEK)
Bilirubin, UA: NEGATIVE
Blood, UA: NEGATIVE
Glucose, UA: NEGATIVE mg/dL
Ketones, POC UA: NEGATIVE mg/dL
Leukocytes, UA: NEGATIVE
Nitrite, UA: NEGATIVE
POC PROTEIN,UA: NEGATIVE
Spec Grav, UA: 1.02 (ref 1.010–1.025)
Urobilinogen, UA: 0.2 E.U./dL
pH, UA: 7 (ref 5.0–8.0)

## 2020-10-22 MED ORDER — NORGESTIM-ETH ESTRAD TRIPHASIC 0.18/0.215/0.25 MG-25 MCG PO TABS
1.0000 | ORAL_TABLET | Freq: Every day | ORAL | 11 refills | Status: AC
  Filled 2020-10-22: qty 28, 28d supply, fill #0
  Filled 2020-12-07: qty 28, 28d supply, fill #1
  Filled 2020-12-27: qty 28, 28d supply, fill #2
  Filled 2021-01-25: qty 28, 28d supply, fill #3

## 2020-10-22 MED ORDER — METOPROLOL SUCCINATE ER 50 MG PO TB24
50.0000 mg | ORAL_TABLET | Freq: Every day | ORAL | 6 refills | Status: DC
  Filled 2020-10-22: qty 30, 30d supply, fill #0
  Filled 2020-11-21: qty 30, 30d supply, fill #1
  Filled 2020-12-20: qty 30, 30d supply, fill #2
  Filled 2021-01-17: qty 30, 30d supply, fill #3
  Filled 2021-02-17: qty 30, 30d supply, fill #4
  Filled 2021-03-21: qty 30, 30d supply, fill #5

## 2020-10-22 NOTE — Patient Instructions (Signed)

## 2020-10-22 NOTE — Progress Notes (Signed)
Has HTN New to area Very fatigue. Referral to GYN.

## 2020-10-22 NOTE — Progress Notes (Signed)
Subjective:  Patient ID: Sara Pearson, female    DOB: Feb 15, 1986  Age: 35 y.o. MRN: [id number]  CC: New Patient (Initial Visit)   HPI Sara Pearson is a 35 year old female with a medical history of bipolar disorder, endometriosis, PCOS, hypertension who relocated from [place] 3 years ago but is yet to find a PCP. Bipolar disorder is managed by [facility] behavioral health and she is currently on medications.  She has endometriosis and PCOS and would like to discuss OCP or hysterectomy for management of her endometriosis and PCOS. She does not have medical insurance. She has a lot of cramping and menorrhagia. Periods have been regular since she lost 9 lbs. Periods lasts 5-7 days every 28-32 days.  Attained menarche at 65 years. Was previously on metformin for PCOS but then she had low sugars and metformin had to be discontinued.  She endorses presence of facial hair. She is always fatigued with low energy and cannot get anything done. This has been on for a few years worse with her cycle.  She was on Metoprolol 79m in the past for her hypertension and would like to get back on it.  She also has pelvic pain which has been present for 3 months but states pain is worse with urination.  She has no hematuria, no urinary frequency.  Past Medical History:  Diagnosis Date  . Bipolar 1 disorder ([place]   . Heel spur   . Hypertension   . Osteogenesis imperfecta   . PCOS (polycystic ovarian syndrome)     No past surgical history on file.  No family history on file.  Allergies  Allergen Reactions  . Codeine   . Iodine   . Rocephin [Ceftriaxone Sodium In Dextrose]   . Zofran [Ondansetron Hcl]     Outpatient Medications Spiller to Visit  Medication Sig Dispense Refill  . ARIPiprazole (ABILIFY) 10 MG tablet TAKE 10 MG BY MOUTH DAILY. 30 tablet 2  . ARIPiprazole (ABILIFY) 10 MG tablet TAKE 1 TABLET BY MOUTH DAILY. 30 tablet 2  . ARIPiprazole, sensor, 10 MG TABS Take 10 mg by mouth  daily. 30 tablet 2  . buPROPion (WELLBUTRIN XL) 150 MG 24 hr tablet TAKE 1 TABLET (150 MG TOTAL) BY MOUTH EVERY MORNING. 30 tablet 2  . hydrOXYzine (ATARAX/VISTARIL) 50 MG tablet TAKE 1 TABLET (50 MG TOTAL) BY MOUTH 3 (THREE) TIMES DAILY AS NEEDED. 90 tablet 2  . lamoTRIgine (LAMICTAL) 100 MG tablet TAKE 1 TABLET (100 MG TOTAL) BY MOUTH DAILY. 30 tablet 2  . prazosin (MINIPRESS) 1 MG capsule TAKE 1 CAPSULE (1 MG TOTAL) BY MOUTH AT BEDTIME. 30 capsule 2  . traZODone (DESYREL) 100 MG tablet TAKE 1 TABLET (100 MG TOTAL) BY MOUTH AT BEDTIME AS NEEDED FOR SLEEP. 30 tablet 2   No facility-administered medications Kail to visit.     ROS Review of Systems  Constitutional: Positive for fatigue. Negative for activity change and appetite change.  HENT: Negative for congestion, sinus pressure and sore throat.   Eyes: Negative for visual disturbance.  Respiratory: Negative for cough, chest tightness, shortness of breath and wheezing.   Cardiovascular: Negative for chest pain and palpitations.  Gastrointestinal: Negative for abdominal distention, abdominal pain and constipation.  Endocrine: Negative for polydipsia.  Genitourinary: Positive for dysuria, menstrual problem and pelvic pain. Negative for flank pain and frequency.  Musculoskeletal: Negative for arthralgias and back pain.  Skin: Negative for rash.  Neurological: Negative for tremors, light-headedness and numbness.  Hematological: Does not  bruise/bleed easily.  Psychiatric/Behavioral: Negative for agitation and behavioral problems.    Objective:  BP (!) 141/91   Pulse 100   Ht _0  (1.676 m)   Wt 279 lb (126.6 kg)   SpO2 98%   BMI 45.03 kg/m   BP/Weight 10/22/2020 01/09/2020 2/77/8242  Systolic BP 353 614 431  Diastolic BP 91 540 89  Wt. (Lbs) 279 - 280  BMI 45.03 - 45.19  Some encounter information is confidential and restricted. Go to Review Flowsheets activity to see all data.      Physical Exam Constitutional:       Appearance: She is well-developed.  Neck:     Vascular: No JVD.  Cardiovascular:     Rate and Rhythm: Normal rate.     Heart sounds: Normal heart sounds. No murmur heard.   Pulmonary:     Effort: Pulmonary effort is normal.     Breath sounds: Normal breath sounds. No wheezing or rales.  Chest:     Chest wall: No tenderness.  Abdominal:     General: Bowel sounds are normal. There is no distension.     Palpations: Abdomen is soft. There is no mass.     Tenderness: There is abdominal tenderness (R pelvic region).  Musculoskeletal:        General: Normal range of motion.     Right lower leg: No edema.     Left lower leg: No edema.  Neurological:     Mental Status: She is alert and oriented to person, place, and time.  Psychiatric:        Mood and Affect: Mood normal.     CMP Latest Ref Rng & Units 03/14/2019  Glucose 70 - 99 mg/dL 97  BUN 6 - 20 mg/dL 6  Creatinine 0.44 - 1.00 mg/dL 0.66  Sodium 135 - 145 mmol/L 138  Potassium 3.5 - 5.1 mmol/L 3.8  Chloride 98 - 111 mmol/L 104  CO2 22 - 32 mmol/L 26  Calcium 8.9 - 10.3 mg/dL 9.4    Lipid Panel  No results found for: CHOL, TRIG, HDL, CHOLHDL, VLDL, LDLCALC, LDLDIRECT  CBC    Component Value Date/Time   WBC 15.2 (H) 03/14/2019 1646   RBC 5.29 (H) 03/14/2019 1646   HGB 14.9 03/14/2019 1646   HCT 44.0 03/14/2019 1646   PLT 231 03/14/2019 1646   MCV 83.2 03/14/2019 1646   MCH 28.2 03/14/2019 1646   MCHC 33.9 03/14/2019 1646   RDW 12.5 03/14/2019 1646   LYMPHSABS 5.2 (H) 03/14/2019 1646   MONOABS 0.7 03/14/2019 1646   EOSABS 0.3 03/14/2019 1646   BASOSABS 0.1 03/14/2019 1646    No results found for: HGBA1C  Assessment & Plan:  1. PCOS (polycystic ovarian syndrome) Uncontrolled Unable to tolerate metformin due to hypoglycemia Will place on OCP - Norgestimate-Ethinyl Estradiol Triphasic (ORTHO TRI-CYCLEN LO) 0.18/0.215/0.25 MG-25 MCG tab; Take 1 tablet by mouth daily.  Dispense: 28 tablet; Refill: 11  2.  Essential hypertension Uncontrolled Initiate metoprolol Counseled on blood pressure goal of less than 130/80, low-sodium, DASH diet, medication compliance, 150 minutes of moderate intensity exercise per week. Discussed medication compliance, adverse effects. - metoprolol succinate (TOPROL-XL) 50 MG 24 hr tablet; Take 1 tablet (50 mg total) by mouth daily. Take with or immediately following a meal.  Dispense: 30 tablet; Refill: 6  3. Pelvic pain UA is negative for UTI Pain could be secondary to endometriosis - POCT URINALYSIS DIP (CLINITEK)  4. Other fatigue Will evaluate for underlying etiologies -  TSH - T4, free - CBC with Differential/Platelet - VITAMIN D 25 Hydroxy (Vit-D Deficiency, Fractures) - CMP14+EGFR  5. Endometriosis Referred to GYN  6. Screening for diabetes mellitus - Hemoglobin A1c    Meds ordered this encounter  Medications  . Norgestimate-Ethinyl Estradiol Triphasic (ORTHO TRI-CYCLEN LO) 0.18/0.215/0.25 MG-25 MCG tab    Sig: Take 1 tablet by mouth daily.    Dispense:  28 tablet    Refill:  11  . metoprolol succinate (TOPROL-XL) 50 MG 24 hr tablet    Sig: Take 1 tablet (50 mg total) by mouth daily. Take with or immediately following a meal.    Dispense:  30 tablet    Refill:  6    Follow-up: Return in about 6 months (around 04/24/2021) for chronic disease management.       Charlott Rakes, MD, FAAFP. [facility] [place]   10/22/2020, 10:12 AM

## 2020-10-23 ENCOUNTER — Other Ambulatory Visit: Payer: Self-pay

## 2020-10-23 ENCOUNTER — Encounter: Payer: Self-pay | Admitting: Family Medicine

## 2020-10-23 LAB — CMP14+EGFR
ALT: 26 IU/L (ref 0–32)
AST: 16 IU/L (ref 0–40)
Albumin/Globulin Ratio: 2 (ref 1.2–2.2)
Albumin: 4.5 g/dL (ref 3.8–4.8)
Alkaline Phosphatase: 78 IU/L (ref 44–121)
BUN/Creatinine Ratio: 11 (ref 9–23)
BUN: 8 mg/dL (ref 6–20)
Bilirubin Total: 0.5 mg/dL (ref 0.0–1.2)
CO2: 22 mmol/L (ref 20–29)
Calcium: 9.7 mg/dL (ref 8.7–10.2)
Chloride: 104 mmol/L (ref 96–106)
Creatinine, Ser: 0.7 mg/dL (ref 0.57–1.00)
Globulin, Total: 2.3 g/dL (ref 1.5–4.5)
Glucose: 83 mg/dL (ref 65–99)
Potassium: 4.6 mmol/L (ref 3.5–5.2)
Sodium: 141 mmol/L (ref 134–144)
Total Protein: 6.8 g/dL (ref 6.0–8.5)
eGFR: 118 mL/min/{1.73_m2} (ref >59)

## 2020-10-23 LAB — CBC WITH DIFFERENTIAL/PLATELET
Basophils Absolute: 0.1 10*3/uL (ref 0.0–0.2)
Basos: 1 %
EOS (ABSOLUTE): 0.2 10*3/uL (ref 0.0–0.4)
Eos: 1 %
Hematocrit: 47 % — ABNORMAL HIGH (ref 34.0–46.6)
Hemoglobin: 15.7 g/dL (ref 11.1–15.9)
Immature Grans (Abs): 0.1 10*3/uL (ref 0.0–0.1)
Immature Granulocytes: 1 %
Lymphocytes Absolute: 4.6 10*3/uL — ABNORMAL HIGH (ref 0.7–3.1)
Lymphs: 30 %
MCH: 26.7 pg (ref 26.6–33.0)
MCHC: 33.4 g/dL (ref 31.5–35.7)
MCV: 80 fL (ref 79–97)
Monocytes Absolute: 0.6 10*3/uL (ref 0.1–0.9)
Monocytes: 4 %
Neutrophils Absolute: 9.5 10*3/uL — ABNORMAL HIGH (ref 1.4–7.0)
Neutrophils: 63 %
Platelets: 251 10*3/uL (ref 150–450)
RBC: 5.87 x10E6/uL — ABNORMAL HIGH (ref 3.77–5.28)
RDW: 12.1 % (ref 11.7–15.4)
WBC: 15.1 10*3/uL — ABNORMAL HIGH (ref 3.4–10.8)

## 2020-10-23 LAB — T4, FREE: Free T4: 1.25 ng/dL (ref 0.82–1.77)

## 2020-10-23 LAB — HEMOGLOBIN A1C
Est. average glucose Bld gHb Est-mCnc: 91 mg/dL
Hgb A1c MFr Bld: 4.8 % (ref 4.8–5.6)

## 2020-10-23 LAB — TSH: TSH: 0.58 u[IU]/mL (ref 0.450–4.500)

## 2020-10-23 LAB — VITAMIN D 25 HYDROXY (VIT D DEFICIENCY, FRACTURES): Vit D, 25-Hydroxy: 12.2 ng/mL — ABNORMAL LOW (ref 30.0–100.0)

## 2020-10-23 MED FILL — Lamotrigine Tab 100 MG: ORAL | 30 days supply | Qty: 30 | Fill #0 | Status: AC

## 2020-10-24 ENCOUNTER — Other Ambulatory Visit: Payer: Self-pay

## 2020-10-31 ENCOUNTER — Ambulatory Visit (HOSPITAL_COMMUNITY): Payer: No Payment, Other | Admitting: Licensed Clinical Social Worker

## 2020-11-12 ENCOUNTER — Other Ambulatory Visit (HOSPITAL_COMMUNITY): Payer: Self-pay | Admitting: Psychiatry

## 2020-11-12 DIAGNOSIS — F411 Generalized anxiety disorder: Secondary | ICD-10-CM

## 2020-11-12 DIAGNOSIS — F4310 Post-traumatic stress disorder, unspecified: Secondary | ICD-10-CM

## 2020-11-12 DIAGNOSIS — F3130 Bipolar disorder, current episode depressed, mild or moderate severity, unspecified: Secondary | ICD-10-CM

## 2020-11-12 MED FILL — Hydroxyzine HCl Tab 50 MG: ORAL | 30 days supply | Qty: 90 | Fill #0 | Status: AC

## 2020-11-13 ENCOUNTER — Other Ambulatory Visit: Payer: Self-pay

## 2020-11-13 ENCOUNTER — Telehealth (INDEPENDENT_AMBULATORY_CARE_PROVIDER_SITE_OTHER): Payer: No Payment, Other | Admitting: Psychiatry

## 2020-11-13 ENCOUNTER — Encounter (HOSPITAL_COMMUNITY): Payer: Self-pay | Admitting: Psychiatry

## 2020-11-13 DIAGNOSIS — F4310 Post-traumatic stress disorder, unspecified: Secondary | ICD-10-CM | POA: Diagnosis not present

## 2020-11-13 DIAGNOSIS — F3130 Bipolar disorder, current episode depressed, mild or moderate severity, unspecified: Secondary | ICD-10-CM

## 2020-11-13 DIAGNOSIS — F411 Generalized anxiety disorder: Secondary | ICD-10-CM

## 2020-11-13 MED ORDER — HYDROXYZINE HCL 50 MG PO TABS: ORAL_TABLET | ORAL | 2 refills | Status: DC

## 2020-11-13 MED ORDER — TRAZODONE HCL 100 MG PO TABS
ORAL_TABLET | Freq: Every evening | ORAL | 2 refills | Status: DC | PRN
  Filled 2020-11-13: qty 30, 30d supply, fill #0

## 2020-11-13 MED ORDER — PRAZOSIN HCL 1 MG PO CAPS
ORAL_CAPSULE | ORAL | 2 refills | Status: DC
  Filled 2020-11-13: qty 30, 30d supply, fill #0
  Filled 2020-12-13: qty 30, 30d supply, fill #1
  Filled 2021-01-09: qty 30, 30d supply, fill #2

## 2020-11-13 MED ORDER — LAMOTRIGINE 100 MG PO TABS
ORAL_TABLET | Freq: Every day | ORAL | 2 refills | Status: DC
  Filled 2020-11-13: qty 30, fill #0
  Filled 2020-11-21: qty 30, 30d supply, fill #0
  Filled 2020-12-20: qty 30, 30d supply, fill #1
  Filled 2021-01-17: qty 30, 30d supply, fill #2

## 2020-11-13 MED ORDER — ARIPIPRAZOLE 10 MG PO TABS
ORAL_TABLET | Freq: Every day | ORAL | 2 refills | Status: DC
  Filled 2020-11-13: qty 30, 30d supply, fill #0

## 2020-11-13 MED ORDER — BUPROPION HCL ER (XL) 150 MG PO TB24
ORAL_TABLET | Freq: Every morning | ORAL | 2 refills | Status: DC
  Filled 2020-11-13: qty 30, 30d supply, fill #0
  Filled 2020-12-13: qty 30, 30d supply, fill #1
  Filled 2021-01-09: qty 30, 30d supply, fill #2

## 2020-11-13 MED ORDER — BUPROPION HCL ER (XL) 150 MG PO TB24
ORAL_TABLET | Freq: Every morning | ORAL | 2 refills | Status: DC
  Filled 2020-11-13: qty 30, 30d supply, fill #0

## 2020-11-13 MED ORDER — PRAZOSIN HCL 1 MG PO CAPS
ORAL_CAPSULE | ORAL | 2 refills | Status: DC
  Filled 2020-11-13: qty 30, 30d supply, fill #0

## 2020-11-13 MED ORDER — TRAZODONE HCL 100 MG PO TABS
ORAL_TABLET | Freq: Every evening | ORAL | 2 refills | Status: DC | PRN
  Filled 2020-11-13: qty 30, 30d supply, fill #0
  Filled 2020-12-13: qty 30, 30d supply, fill #1
  Filled 2021-01-09: qty 30, 30d supply, fill #2

## 2020-11-13 MED ORDER — ARIPIPRAZOLE 10 MG PO TABS
ORAL_TABLET | Freq: Every day | ORAL | 2 refills | Status: DC
  Filled 2020-11-13: qty 30, 30d supply, fill #0
  Filled 2020-12-07: qty 30, 30d supply, fill #1
  Filled 2021-01-09: qty 30, 30d supply, fill #2

## 2020-11-13 NOTE — Progress Notes (Signed)
BH MD/PA/NP OP Progress Note Virtual Visit via Video Note  I connected with [name] on 11/13/20 at 10:00 AM EDT by a video enabled telemedicine application and verified that I am speaking with the correct person using two identifiers.  Location: Patient: Work Provider: Clinic   I discussed the limitations of evaluation and management by telemedicine and the availability of in person appointments. The patient expressed understanding and agreed to proceed.  I provided 30 minutes of non-face-to-face time during this encounter.    11/13/2020 10:23 AM Sara Pearson  MRN:  [id number]  Chief Complaint: "Thing have been really good. My meds are workinig"  HPI: 35 year old female seen today for followup psychiatric evaluation. She has a psychiatric history of Bipolar 1, anxiety, depression, and PTSD. She is currently managed on Trazodone 100 mg nightly, Abilify 10 mg daily, Lamictal 100 mg daily, prazosin 1 mg nightly, hydroxyzine 50 mg three times daily, and Wellbutrin XL 150 mg daily. She notes her medications are effective in managing her psychiatric conditions.   Today she is well-groomed, pleasant, cooperative, engaged in conversation, and maintained eye contact.  She informed provider that things have been really good since her last visit.  She notes that she finds all of her medications effective.  She informed provider that her anxiety and depression are well managed.  Provider conducted a GAD-7 and patient scored a 6, at her last visit she scored a 21.  Provider also conducted a PHQ-9 and patient scored a 3, at her last visit she scored 18.  She notes that since increasing trazodone her sleep has been good and endorses having a good appetite.  She also notes that her mood has been stable and denies SI/HI/VAH, paranoia, or mania.  No medication changes made today.  Patient agreeable to continue medication as prescribed.  She will follow-up with outpatient counseling for therapy.  No  other concerns noted at this time.    Visit Diagnosis:    ICD-10-CM   1. Bipolar I disorder, most recent episode depressed (HCC)  F31.30 buPROPion (WELLBUTRIN XL) 150 MG 24 hr tablet    lamoTRIgine (LAMICTAL) 100 MG tablet    traZODone (DESYREL) 100 MG tablet  2. GAD (generalized anxiety disorder)  F41.1 hydrOXYzine (ATARAX/VISTARIL) 50 MG tablet    traZODone (DESYREL) 100 MG tablet  3. PTSD (post-traumatic stress disorder)  F43.10 prazosin (MINIPRESS) 1 MG capsule    Past Psychiatric History:  Bipolar 1, anxiety, depression, and PTSD.  Past Medical History:  Past Medical History:  Diagnosis Date  . Bipolar 1 disorder (HCC)   . Heel spur   . Hypertension   . Osteogenesis imperfecta   . PCOS (polycystic ovarian syndrome)    No past surgical history on file.  Family Psychiatric History: Unknown   Family History: No family history on file.  Social History:  Social History   Socioeconomic History  . Marital status: Married    Spouse name: Not on file  . Number of children: Not on file  . Years of education: Not on file  . Highest education level: Not on file  Occupational History  . Not on file  Tobacco Use  . Smoking status: Never Smoker  . Smokeless tobacco: Never Used  Substance and Sexual Activity  . Alcohol use: Yes  . Drug use: Never  . Sexual activity: Not on file  Other Topics Concern  . Not on file  Social History Narrative  . Not on file   Social  Determinants of Health   Financial Resource Strain: High Risk  . Difficulty of Paying Living Expenses: Hard  Food Insecurity: Food Insecurity Present  . Worried About Programme researcher, broadcasting/film/video in the Last Year: Sometimes true  . Ran Out of Food in the Last Year: Sometimes true  Transportation Needs: No Transportation Needs  . Lack of Transportation (Medical): No  . Lack of Transportation (Non-Medical): No  Physical Activity: Sufficiently Active  . Days of Exercise per Week: 3 days  . Minutes of Exercise per  Session: 60 min  Stress: Stress Concern Present  . Feeling of Stress : Very much  Social Connections: Socially Isolated  . Frequency of Communication with Friends and Family: Once a week  . Frequency of Social Gatherings with Friends and Family: Once a week  . Attends Religious Services: Never  . Active Member of Clubs or Organizations: No  . Attends Banker Meetings: Never  . Marital Status: Married    Allergies:  Allergies  Allergen Reactions  . Codeine   . Iodine   . Rocephin [Ceftriaxone Sodium In Dextrose]   . Zofran [Ondansetron Hcl]     Metabolic Disorder Labs: Lab Results  Component Value Date   HGBA1C 4.8 10/22/2020   No results found for: PROLACTIN No results found for: CHOL, TRIG, HDL, CHOLHDL, VLDL, LDLCALC Lab Results  Component Value Date   TSH 0.580 10/22/2020    Therapeutic Level Labs: No results found for: LITHIUM Lab Results  Component Value Date   VALPROATE 27 (L) 05/04/2020   No components found for:  CBMZ  Current Medications: Current Outpatient Medications  Medication Sig Dispense Refill  . ARIPiprazole (ABILIFY) 10 MG tablet TAKE 1 TABLET BY MOUTH DAILY. 30 tablet 2  . buPROPion (WELLBUTRIN XL) 150 MG 24 hr tablet TAKE 1 TABLET (150 MG TOTAL) BY MOUTH EVERY MORNING. 30 tablet 2  . hydrOXYzine (ATARAX/VISTARIL) 50 MG tablet TAKE 1 TABLET (50 MG TOTAL) BY MOUTH 3 (THREE) TIMES DAILY AS NEEDED. 90 tablet 2  . lamoTRIgine (LAMICTAL) 100 MG tablet TAKE 1 TABLET (100 MG TOTAL) BY MOUTH DAILY. 30 tablet 2  . metoprolol succinate (TOPROL-XL) 50 MG 24 hr tablet Take 1 tablet (50 mg total) by mouth daily. Take with or immediately following a meal. 30 tablet 6  . Norgestimate-Ethinyl Estradiol Triphasic (ORTHO TRI-CYCLEN LO) 0.18/0.215/0.25 MG-25 MCG tab Take 1 tablet by mouth daily. 28 tablet 11  . prazosin (MINIPRESS) 1 MG capsule TAKE 1 CAPSULE (1 MG TOTAL) BY MOUTH AT BEDTIME. 30 capsule 2  . traZODone (DESYREL) 100 MG tablet TAKE 1  TABLET (100 MG TOTAL) BY MOUTH AT BEDTIME AS NEEDED FOR SLEEP. 30 tablet 2   No current facility-administered medications for this visit.     Musculoskeletal: Strength & Muscle Tone: Unable to assess due to telehealth visit Gait & Station: Unable to assess due to telehealth visit Patient leans: N/A  Psychiatric Specialty Exam: Review of Systems  There were no vitals taken for this visit.There is no height or weight on file to calculate BMI.  General Appearance: Well Groomed  Eye Contact:  Good  Speech:  Clear and Coherent and Normal Rate  Volume:  Normal  Mood:  Euthymic  Affect:  Appropriate and Congruent  Thought Process:  Coherent, Goal Directed and Linear  Orientation:  Full (Time, Place, and Person)  Thought Content: WDL and Logical   Suicidal Thoughts:  No  Homicidal Thoughts:  No  Memory:  Immediate;   Good Recent;  Good Remote;   Good  Judgement:  Good  Insight:  Good  Psychomotor Activity:  Normal  Concentration:  Concentration: Good and Attention Span: Good  Recall:  Good  Fund of Knowledge: Good  Language: Good  Akathisia:  No  Handed:  Right  AIMS (if indicated): Not done  Assets:  Communication Skills Desire for Improvement Financial Resources/Insurance Housing Intimacy Social Support  ADL's:  Intact  Cognition: WNL  Sleep:  Good   Screenings: AUDIT   Advertising copywriter from 04/16/2020 in [facility]  Alcohol Use Disorder Identification Test Final Score (AUDIT) 8    GAD-7   Flowsheet Row Video Visit from 11/13/2020 in [facility] Office Visit from 10/22/2020 in [facility] Counselor from 09/17/2020 in [facility] Video Visit from 08/17/2020 in [facility] Video Visit from 05/18/2020 in [facility]  Total GAD-7 Score 6 17 10 21  0    PHQ2-9   Flowsheet Row Video Visit from  11/13/2020 in [facility] Office Visit from 10/22/2020 in [facility] Counselor from 09/17/2020 in [facility] Video Visit from 08/17/2020 in [facility] Counselor from 05/31/2020 in [facility]  PHQ-2 Total Score 1 3 2 5 2   PHQ-9 Total Score 3 12 14 18 11     Flowsheet Row Video Visit from 08/17/2020 in [facility] Counselor from 04/16/2020 in [facility]  C-SSRS RISK CATEGORY Error: Question 2 not populated Low Risk       Assessment and Plan: Patient notes that she is doing well on her current medication regimen.  No medication changes made today.  Patient agreeable to continue medication as prescribed.  1. Bipolar I disorder, most recent episode depressed (HCC)  Continue- buPROPion (WELLBUTRIN XL) 150 MG 24 hr tablet; TAKE 1 TABLET (150 MG TOTAL) BY MOUTH EVERY MORNING.  Dispense: 30 tablet; Refill: 2 Continue- lamoTRIgine (LAMICTAL) 100 MG tablet; TAKE 1 TABLET (100 MG TOTAL) BY MOUTH DAILY.  Dispense: 30 tablet; Refill: 2 Continue- traZODone (DESYREL) 100 MG tablet; TAKE 1 TABLET (100 MG TOTAL) BY MOUTH AT BEDTIME AS NEEDED FOR SLEEP.  Dispense: 30 tablet; Refill: 2  2. GAD (generalized anxiety disorder)  Continue- hydrOXYzine (ATARAX/VISTARIL) 50 MG tablet; TAKE 1 TABLET (50 MG TOTAL) BY MOUTH 3 (THREE) TIMES DAILY AS NEEDED.  Dispense: 90 tablet; Refill: 2 Continue- traZODone (DESYREL) 100 MG tablet; TAKE 1 TABLET (100 MG TOTAL) BY MOUTH AT BEDTIME AS NEEDED FOR SLEEP.  Dispense: 30 tablet; Refill: 2  3.  PTSD (post-traumatic stress disorder)  Continue- prazosin (MINIPRESS) 1 MG capsule; TAKE 1 CAPSULE (1 MG TOTAL) BY MOUTH AT BEDTIME.  Dispense: 30 capsule; Refill: 2     Follow-up in 3 months Follow-up with therapy  [facility], NP 11/13/2020, 10:23 AM

## 2020-11-14 ENCOUNTER — Other Ambulatory Visit: Payer: Self-pay

## 2020-11-21 ENCOUNTER — Other Ambulatory Visit: Payer: Self-pay

## 2020-11-23 ENCOUNTER — Other Ambulatory Visit: Payer: Self-pay

## 2020-11-28 ENCOUNTER — Ambulatory Visit (INDEPENDENT_AMBULATORY_CARE_PROVIDER_SITE_OTHER): Payer: No Payment, Other | Admitting: Licensed Clinical Social Worker

## 2020-11-28 ENCOUNTER — Other Ambulatory Visit: Payer: Self-pay

## 2020-11-28 DIAGNOSIS — F3130 Bipolar disorder, current episode depressed, mild or moderate severity, unspecified: Secondary | ICD-10-CM

## 2020-11-28 NOTE — Progress Notes (Signed)
   THERAPIST PROGRESS NOTE  Session Time: 22   Participation Level: Active  Behavioral Response: CasualAlertEuthymic  Type of Therapy: Individual Therapy  Treatment Goals addressed: Diagnosis: bipolar depression   Interventions: CBT and Supportive  Summary: Sara Pearson is a 35 y.o. female who presents with bipolar depression.   Suicidal/Homicidal: NAwithout intent/plan  Therapist Response:    Subjective/Objective:  Pt was alert and oriented x 5 she was dressed casually and engaged well in therapy session. Pt presented with euthymic moood/affect. She was cooperative and maintined good eye contact.   Pt reports things have been going good. She has been utliiing her coping skills learned in session. Examples provided are mediation as needed, walking 3 x weekly, and journallying 1 x weekly. Sara Pearson states this has been a huge help to her. LCSW use uncondional postive regards and reframing in todays session. She reports that she has been taking all her medications as prescribed. Money has shown improvement over the past 7 months as   evidence by PHQ-9 scoring her highest score of 23 and now scoring a 2. She also had an 18 as her highest score for GAD-7 and now scored a 3.    Assessment/Plan: Plan for pt is to f/u montlyh for LCSW and to follow up as directed for medications mgmt. She Endores syptoms for fatigues, lack of motivation, and increase in apetite. She does meet crtieria for bipolar depression.   Plan: Return again in 4  weeks.     Sara Cooks, LCSW 11/28/2020

## 2020-12-03 ENCOUNTER — Other Ambulatory Visit: Payer: Self-pay

## 2020-12-03 ENCOUNTER — Ambulatory Visit (INDEPENDENT_AMBULATORY_CARE_PROVIDER_SITE_OTHER): Payer: Medicaid Other | Admitting: Obstetrics and Gynecology

## 2020-12-03 ENCOUNTER — Encounter: Payer: Self-pay | Admitting: Obstetrics and Gynecology

## 2020-12-03 VITALS — BP 135/90 | HR 81 | Wt 276.0 lb

## 2020-12-03 DIAGNOSIS — E282 Polycystic ovarian syndrome: Secondary | ICD-10-CM

## 2020-12-03 DIAGNOSIS — N809 Endometriosis, unspecified: Secondary | ICD-10-CM

## 2020-12-03 DIAGNOSIS — R102 Pelvic and perineal pain unspecified side: Secondary | ICD-10-CM

## 2020-12-03 DIAGNOSIS — Z5941 Food insecurity: Secondary | ICD-10-CM

## 2020-12-03 NOTE — Progress Notes (Signed)
   Subjective:    Patient ID: Sara Pearson, female    DOB: [birth year], 35 y.o.   MRN: [id number]  HPI 35 yo G1P0 seen at [facility].  Per pt she has a history of PCOS and endometriosis since age 84.  Pt notes endometriosis was diagnosed with diagnostic laparoscopy.  She noted there seemed to be some bowel involvement with the endometriosis.  She has tried lupron and extended regimen OCP in the past.  Both were effective.  She has just started OCP in May.  Pt initially wanted hysterectomy  as definitive therapy, but is willing to try other treatments.  Neither pt or spouse wants childrena t this time.  Pt has lost 90 pounds over the last few years to help address her PCOS.  Last A1c was 4.8.  Pt states she primarily has pain with menses or occasionally with intercourse.   Review of Systems     Objective:   Physical Exam Constitutional:      Appearance: Normal appearance. She is obese.  HENT:     Head: Normocephalic and atraumatic.  Cardiovascular:     Rate and Rhythm: Normal rate and regular rhythm.     Heart sounds: Normal heart sounds.  Pulmonary:     Effort: Pulmonary effort is normal.     Breath sounds: Normal breath sounds.  Abdominal:     General: Abdomen is flat.     Palpations: Abdomen is soft.     Comments: Mild discomfort in RLQ  Genitourinary:    Comments: SVE: no CMT, mild bilateral adnexal pain, difficult assessment of uterus due to body habitus Neurological:     Mental Status: She is alert.   Vitals:   12/03/20 1324  BP: 135/90  Pulse: 81         Assessment & Plan:   1. Food insecurity  - AMBULATORY REFERRAL TO [facility]  2. PCOS (polycystic ovarian syndrome) Continue current exercise and weight loss regimen  3. Endometriosis Will have trial of extended regimen OCP for pn control, reassess in 4 months.  If ineffective discuss elagolix, Lupron or hysterectomy  4. Pelvic pain in female As above  Refer to [facility] for pap, pt to inquire  to [facility] financial aid in case she needs surgery Follow up in 4 months    Warden Fillers, MD Faculty Attending, [facility]

## 2020-12-03 NOTE — Patient Instructions (Addendum)
BCCCP (Breast and Cervical Cancer Control Program) [phone]  Give info on [facility] financial aid

## 2020-12-07 ENCOUNTER — Other Ambulatory Visit: Payer: Self-pay

## 2020-12-14 ENCOUNTER — Other Ambulatory Visit: Payer: Self-pay

## 2020-12-19 ENCOUNTER — Ambulatory Visit (HOSPITAL_COMMUNITY): Payer: No Payment, Other | Admitting: Licensed Clinical Social Worker

## 2020-12-21 ENCOUNTER — Other Ambulatory Visit: Payer: Self-pay

## 2020-12-27 ENCOUNTER — Other Ambulatory Visit: Payer: Self-pay

## 2020-12-28 ENCOUNTER — Other Ambulatory Visit: Payer: Self-pay

## 2021-01-07 ENCOUNTER — Ambulatory Visit (HOSPITAL_COMMUNITY): Payer: No Payment, Other | Admitting: Licensed Clinical Social Worker

## 2021-01-10 ENCOUNTER — Other Ambulatory Visit: Payer: Self-pay

## 2021-01-11 ENCOUNTER — Other Ambulatory Visit: Payer: Self-pay

## 2021-01-18 ENCOUNTER — Other Ambulatory Visit: Payer: Self-pay

## 2021-01-23 ENCOUNTER — Ambulatory Visit (INDEPENDENT_AMBULATORY_CARE_PROVIDER_SITE_OTHER): Payer: No Payment, Other | Admitting: Licensed Clinical Social Worker

## 2021-01-23 ENCOUNTER — Other Ambulatory Visit: Payer: Self-pay

## 2021-01-23 DIAGNOSIS — F3130 Bipolar disorder, current episode depressed, mild or moderate severity, unspecified: Secondary | ICD-10-CM

## 2021-01-23 DIAGNOSIS — F411 Generalized anxiety disorder: Secondary | ICD-10-CM | POA: Diagnosis not present

## 2021-01-23 NOTE — Progress Notes (Signed)
   THERAPIST PROGRESS NOTE  Session Time: 17  Participation Level: Active  Behavioral Response: CasualAlertAnxious and Depressed  Type of Therapy: Individual Therapy  Treatment Goals addressed: Anxiety and Diagnosis: major depression  Interventions: CBT and Supportive  Summary: Sara Pearson is a 35 y.o. female who presents with depressed, flat, and anxious mood\affect.  Patient was alert and oriented x5.  Sara Pearson was dressed casually and engaged well and maintained good eye contact in session.  Patient reports primary stressor as family she reports that her grandmother recently had her third stroke and has been placed in a skilled nursing facility.  Patient reports stress, tension, and worry.  Sara Pearson reports that she cannot financially support going out to [place], plane tickets around $400, trying to get around $600 roundtrip, and buses are around 300 roundtrip.  LCSW spoke spoke with patient about what she can control versus what she cannot control.  Patient to focus on saving up money for a transportation to get out to [place] to see her grandmother in the next 3 months.  Suicidal/Homicidal: NAwithout intent/plan  Therapist Response:    Intervention/Plan: LCSW uses cognitive behavioral therapy and supportive therapy in session.  LCSW used open-ended questions.  LCSW utilized calm and supportive tone.  LCSW utilized cognitive restructuring as evidenced by above.  LCSW utilized encouragement and advice giving.  Plan for patient is to stay $20 out of every paycheck to help save up for transportation to get out to [place], objective for patient is to have enough saved up by Thanksgiving to make a trip out there.  Patient was assessed with GAD-7 last score was a 3 today score was a 13.  PHQ-9 was also administered in today's session last known score was a 2 today score was a 5.  Patient was agreeable to utilize positive information was provided to her in today's session and write down 3  weekly.  Plan: Return again in 4   weeks.     Weber Cooks, LCSW 01/23/2021

## 2021-01-25 ENCOUNTER — Other Ambulatory Visit: Payer: Self-pay

## 2021-02-01 ENCOUNTER — Other Ambulatory Visit: Payer: Self-pay

## 2021-02-09 ENCOUNTER — Other Ambulatory Visit (HOSPITAL_COMMUNITY): Payer: Self-pay | Admitting: Psychiatry

## 2021-02-09 DIAGNOSIS — F411 Generalized anxiety disorder: Secondary | ICD-10-CM

## 2021-02-09 DIAGNOSIS — F3130 Bipolar disorder, current episode depressed, mild or moderate severity, unspecified: Secondary | ICD-10-CM

## 2021-02-09 DIAGNOSIS — F4310 Post-traumatic stress disorder, unspecified: Secondary | ICD-10-CM

## 2021-02-11 ENCOUNTER — Other Ambulatory Visit: Payer: Self-pay

## 2021-02-11 MED ORDER — PRAZOSIN HCL 1 MG PO CAPS
ORAL_CAPSULE | ORAL | 2 refills | Status: DC
  Filled 2021-02-11: qty 30, 30d supply, fill #0

## 2021-02-11 MED ORDER — ARIPIPRAZOLE 10 MG PO TABS
ORAL_TABLET | Freq: Every day | ORAL | 2 refills | Status: DC
  Filled 2021-02-11: qty 30, 30d supply, fill #0

## 2021-02-11 MED ORDER — BUPROPION HCL ER (XL) 150 MG PO TB24
ORAL_TABLET | Freq: Every morning | ORAL | 2 refills | Status: DC
  Filled 2021-02-11: qty 30, 30d supply, fill #0

## 2021-02-11 MED ORDER — TRAZODONE HCL 100 MG PO TABS
ORAL_TABLET | Freq: Every evening | ORAL | 2 refills | Status: DC | PRN
  Filled 2021-02-11: qty 30, 30d supply, fill #0

## 2021-02-12 ENCOUNTER — Other Ambulatory Visit: Payer: Self-pay

## 2021-02-12 ENCOUNTER — Encounter (HOSPITAL_COMMUNITY): Payer: Self-pay | Admitting: Psychiatry

## 2021-02-12 ENCOUNTER — Telehealth (INDEPENDENT_AMBULATORY_CARE_PROVIDER_SITE_OTHER): Payer: No Payment, Other | Admitting: Psychiatry

## 2021-02-12 DIAGNOSIS — F4310 Post-traumatic stress disorder, unspecified: Secondary | ICD-10-CM

## 2021-02-12 DIAGNOSIS — F411 Generalized anxiety disorder: Secondary | ICD-10-CM

## 2021-02-12 DIAGNOSIS — F3130 Bipolar disorder, current episode depressed, mild or moderate severity, unspecified: Secondary | ICD-10-CM

## 2021-02-12 MED ORDER — PRAZOSIN HCL 1 MG PO CAPS
ORAL_CAPSULE | ORAL | 3 refills | Status: DC
  Filled 2021-02-12: qty 30, fill #0
  Filled 2021-02-12: qty 30, 30d supply, fill #0
  Filled 2021-03-14: qty 30, 30d supply, fill #1
  Filled 2021-04-15: qty 30, 30d supply, fill #2

## 2021-02-12 MED ORDER — ARIPIPRAZOLE 15 MG PO TABS
15.0000 mg | ORAL_TABLET | Freq: Every day | ORAL | 3 refills | Status: DC
  Filled 2021-02-12: qty 30, 30d supply, fill #0
  Filled 2021-03-14: qty 30, 30d supply, fill #1
  Filled 2021-04-16: qty 30, 30d supply, fill #2

## 2021-02-12 MED ORDER — TRAZODONE HCL 100 MG PO TABS
ORAL_TABLET | Freq: Every evening | ORAL | 3 refills | Status: DC | PRN
  Filled 2021-02-12: qty 30, 30d supply, fill #0
  Filled 2021-02-12: qty 30, fill #0
  Filled 2021-03-07: qty 30, 30d supply, fill #1
  Filled 2021-04-04: qty 30, 30d supply, fill #2

## 2021-02-12 MED ORDER — BUPROPION HCL ER (XL) 150 MG PO TB24
ORAL_TABLET | Freq: Every morning | ORAL | 3 refills | Status: DC
Start: 2021-02-12 — End: 2021-05-21
  Filled 2021-02-12: qty 30, fill #0
  Filled 2021-02-12: qty 30, 30d supply, fill #0
  Filled 2021-03-14: qty 30, 30d supply, fill #1
  Filled 2021-04-15: qty 30, 30d supply, fill #2

## 2021-02-12 MED ORDER — HYDROXYZINE HCL 50 MG PO TABS
ORAL_TABLET | ORAL | 3 refills | Status: DC
  Filled 2021-02-12 – 2021-04-04 (×2): qty 90, 30d supply, fill #0

## 2021-02-12 MED ORDER — LAMOTRIGINE 100 MG PO TABS
ORAL_TABLET | Freq: Every day | ORAL | 3 refills | Status: DC
  Filled 2021-02-12 – 2021-02-17 (×2): qty 30, 30d supply, fill #0
  Filled 2021-03-21: qty 30, 30d supply, fill #1
  Filled 2021-04-19: qty 30, 30d supply, fill #2

## 2021-02-12 NOTE — Progress Notes (Signed)
BH MD/PA/NP OP Progress Note Virtual Visit via Video Note  I connected with Sara Pearson on 02/12/21 at 11:30 AM EDT by a video enabled telemedicine application and verified that I am speaking with the correct person using two identifiers.  Location: Patient: Work Provider: Clinic   I discussed the limitations of evaluation and management by telemedicine and the availability of in person appointments. The patient expressed understanding and agreed to proceed.  I provided 30 minutes of non-face-to-face time during this encounter.    02/12/2021 11:58 AM Sara Pearson  MRN:  [id number]  Chief Complaint: "My depression has been acting up lately"  HPI: 35 year old female seen today for followup psychiatric evaluation. She has a psychiatric history of Bipolar 1, anxiety, depression, and PTSD. She is currently managed on Trazodone 100 mg nightly, Abilify 10 mg daily, Lamictal 100 mg daily, prazosin 1 mg nightly, hydroxyzine 50 mg three times daily, and Wellbutrin XL 150 mg daily. She notes her medications are somewhat effective in managing her psychiatric conditions.    Today she is well-groomed, pleasant, cooperative, engaged in conversation, and maintained eye contact.  She informed provider that lately her depression has been acting up.  She denies stressful life changes.  She also notes that her anxiety has worsened.  She informed Clinical research associatewriter that she has racing thoughts, irritability, fluctuations in mood, and paranoia (noting that she feels something bad will happen).  She informed Clinical research associatewriter that lately her sleep has been off and reports sleeping 4 to 5 hours nightly.  She also endorses psychomotor retardation and notes that her boss informed her that recently she has been moving slower.  Today provider conducted a GAD-7 and patient scored a 17, at her last visit she scored a 6.  Provider also conducted a PHQ-9 and patient scored a 23, at her last visit she scored a 3.  She endorses having a poor  appetite however denies weight loss.  She she endorses passive SI however denies wanting to harm herself today.    Today she is agreeable to increasing Abilify 10mg  to 15 mg to help manage mood.  Provider recommended increasing trazodone however patient notes that when she takes trazodone it is effective and request not to increase this at this time.  She will continue her other medications as prescribed and follow-up with outpatient counseling for therapy.  No other concerns at this time.      Visit Diagnosis:    ICD-10-CM   1. Bipolar I disorder, most recent episode depressed (HCC)  F31.30 ARIPiprazole (ABILIFY) 15 MG tablet    buPROPion (WELLBUTRIN XL) 150 MG 24 hr tablet    lamoTRIgine (LAMICTAL) 100 MG tablet    traZODone (DESYREL) 100 MG tablet    2. GAD (generalized anxiety disorder)  F41.1 hydrOXYzine (ATARAX/VISTARIL) 50 MG tablet    traZODone (DESYREL) 100 MG tablet    3. PTSD (post-traumatic stress disorder)  F43.10 prazosin (MINIPRESS) 1 MG capsule      Past Psychiatric History:  Bipolar 1, anxiety, depression, and PTSD.  Past Medical History:  Past Medical History:  Diagnosis Date   Bipolar 1 disorder (HCC)    Heel spur    Hypertension    Osteogenesis imperfecta    PCOS (polycystic ovarian syndrome)    History reviewed. No pertinent surgical history.  Family Psychiatric History: Unknown    Family History: History reviewed. No pertinent family history.  Social History:  Social History   Socioeconomic History   Marital status: Married  Spouse name: Not on file   Number of children: Not on file   Years of education: Not on file   Highest education level: Not on file  Occupational History   Not on file  Tobacco Use   Smoking status: Never   Smokeless tobacco: Never  Vaping Use   Vaping Use: Every day  Substance and Sexual Activity   Alcohol use: Yes   Drug use: Never   Sexual activity: Yes  Other Topics Concern   Not on file  Social History  Narrative   Not on file   Social Determinants of Health   Financial Resource Strain: Medium Risk   Difficulty of Paying Living Expenses: Somewhat hard  Food Insecurity: Food Insecurity Present   Worried About Running Out of Food in the Last Year: Sometimes true   Ran Out of Food in the Last Year: Sometimes true  Transportation Needs: No Transportation Needs   Lack of Transportation (Medical): No   Lack of Transportation (Non-Medical): No  Physical Activity: Sufficiently Active   Days of Exercise per Week: 3 days   Minutes of Exercise per Session: 60 min  Stress: Stress Concern Present   Feeling of Stress : To some extent  Social Connections: Socially Isolated   Frequency of Communication with Friends and Family: Once a week   Frequency of Social Gatherings with Friends and Family: Once a week   Attends Religious Services: Never   Database administrator or Organizations: No   Attends Banker Meetings: Never   Marital Status: Married    Allergies:  Allergies  Allergen Reactions   Codeine    Iodine    Rocephin [Ceftriaxone Sodium In Dextrose]    Zofran [Ondansetron Hcl]     Metabolic Disorder Labs: Lab Results  Component Value Date   HGBA1C 4.8 10/22/2020   No results found for: PROLACTIN No results found for: CHOL, TRIG, HDL, CHOLHDL, VLDL, LDLCALC Lab Results  Component Value Date   TSH 0.580 10/22/2020    Therapeutic Level Labs: No results found for: LITHIUM Lab Results  Component Value Date   VALPROATE 27 (L) 05/04/2020   No components found for:  CBMZ  Current Medications: Current Outpatient Medications  Medication Sig Dispense Refill   ARIPiprazole (ABILIFY) 15 MG tablet Take 1 tablet (15 mg total) by mouth daily. 30 tablet 3   buPROPion (WELLBUTRIN XL) 150 MG 24 hr tablet TAKE 1 TABLET (150 MG TOTAL) BY MOUTH EVERY MORNING. 30 tablet 3   hydrOXYzine (ATARAX/VISTARIL) 50 MG tablet TAKE 1 TABLET (50 MG TOTAL) BY MOUTH 3 (THREE) TIMES DAILY  AS NEEDED. 90 tablet 3   lamoTRIgine (LAMICTAL) 100 MG tablet TAKE 1 TABLET (100 MG TOTAL) BY MOUTH DAILY. 30 tablet 3   metoprolol succinate (TOPROL-XL) 50 MG 24 hr tablet Take 1 tablet (50 mg total) by mouth daily. Take with or immediately following a meal. 30 tablet 6   Norgestimate-Ethinyl Estradiol Triphasic (ORTHO TRI-CYCLEN LO) 0.18/0.215/0.25 MG-25 MCG tab Take 1 tablet by mouth daily. 28 tablet 11   prazosin (MINIPRESS) 1 MG capsule TAKE 1 CAPSULE (1 MG TOTAL) BY MOUTH AT BEDTIME. 30 capsule 3   traZODone (DESYREL) 100 MG tablet TAKE 1 TABLET (100 MG TOTAL) BY MOUTH AT BEDTIME AS NEEDED FOR SLEEP. 30 tablet 3   No current facility-administered medications for this visit.     Musculoskeletal: Strength & Muscle Tone:  Unable to assess due to telehealth visit Gait & Station:  Unable to assess due to  telehealth visit Patient leans: N/A  Psychiatric Specialty Exam: Review of Systems  There were no vitals taken for this visit.There is no height or weight on file to calculate BMI.  General Appearance: Well Groomed  Eye Contact:  Good  Speech:  Clear and Coherent and Normal Rate  Volume:  Normal  Mood:  Anxious and Depressed  Affect:  Appropriate and Congruent  Thought Process:  Coherent, Goal Directed and Linear  Orientation:  Full (Time, Place, and Person)  Thought Content: Logical and Paranoid Ideation   Suicidal Thoughts:  No  Homicidal Thoughts:  No  Memory:  Immediate;   Good Recent;   Good Remote;   Good  Judgement:  Good  Insight:  Good  Psychomotor Activity:  Normal  Concentration:  Concentration: Good and Attention Span: Good  Recall:  Good  Fund of Knowledge: Good  Language: Good  Akathisia:  No  Handed:  Right  AIMS (if indicated): Not done  Assets:  Communication Skills Desire for Improvement Financial Resources/Insurance Housing Intimacy Social Support  ADL's:  Intact  Cognition: WNL  Sleep:  Fair   Screenings: AUDIT    Advertising copywriter  from 11/28/2020 in [facility] Counselor from 04/16/2020 in [facility]  Alcohol Use Disorder Identification Test Final Score (AUDIT) 6 8      GAD-7    Flowsheet Row Video Visit from 02/12/2021 in [facility] Counselor from 01/23/2021 in [facility] Office Visit from 12/03/2020 in [facility] Counselor from 11/28/2020 in [facility] Video Visit from 11/13/2020 in [facility]  Total GAD-7 Score 17 13 4 3 6       PHQ2-9    Flowsheet Row Video Visit from 02/12/2021 in [facility] Counselor from 01/23/2021 in [facility] Office Visit from 12/03/2020 in Center [facility] Counselor from 11/28/2020 in [facility] Video Visit from 11/13/2020 in [facility]  PHQ-2 Total Score 5 2 0 0 1  PHQ-9 Total Score 23 5 1 2 3       Flowsheet Row Video Visit from 02/12/2021 in [facility] Video Visit from 08/17/2020 in [facility] Counselor from 04/16/2020 in [facility]  C-SSRS RISK CATEGORY Error: Q7 should not be populated when Q6 is No Error: Question 2 not populated Low Risk        Assessment and Plan: Patient endorses symptoms of anxiety, depression, hypomania, and insomnia. Today she is agreeable to increasing Abilify 10mg  to 15 mg to help manage mood.  Provider recommended increasing trazodone however patient notes that when she takes trazodone it is effective and request not to increase this at this time.  She will continue her other medications as prescribed.  1. Bipolar I disorder, most recent episode depressed (HCC)  Continue- buPROPion  (WELLBUTRIN XL) 150 MG 24 hr tablet; TAKE 1 TABLET (150 MG TOTAL) BY MOUTH EVERY MORNING.  Dispense: 30 tablet; Refill: 3 Continue- lamoTRIgine (LAMICTAL) 100 MG tablet; TAKE 1 TABLET (100 MG TOTAL) BY MOUTH DAILY.  Dispense: 30 tablet; Refill: 2 Continue- traZODone (DESYREL) 100 MG tablet; TAKE 1 TABLET (100 MG TOTAL) BY MOUTH AT BEDTIME AS NEEDED FOR SLEEP.  Dispense: 30 tablet; Refill: 3 Increased- ARIPiprazole (ABILIFY) 15 MG tablet; Take 1  tablet (15 mg total) by mouth daily.  Dispense: 30 tablet; Refill: 3  2. GAD (generalized anxiety disorder)  Continue- hydrOXYzine (ATARAX/VISTARIL) 50 MG tablet; TAKE 1 TABLET (50 MG TOTAL) BY MOUTH 3 (THREE) TIMES DAILY AS NEEDED.  Dispense: 90 tablet; Refill: 3 Continue- traZODone (DESYREL) 100 MG tablet; TAKE 1 TABLET (100 MG TOTAL) BY MOUTH AT BEDTIME AS NEEDED FOR SLEEP.  Dispense: 30 tablet; Refill: 3  3.  PTSD (post-traumatic stress disorder)  Continue- prazosin (MINIPRESS) 1 MG capsule; TAKE 1 CAPSULE (1 MG TOTAL) BY MOUTH AT BEDTIME.  Dispense: 30 capsule; Refill: 3      Follow-up in 3 months Follow-up with therapy  Shanna Cisco, NP 02/12/2021, 11:58 AM

## 2021-02-19 ENCOUNTER — Other Ambulatory Visit: Payer: Self-pay

## 2021-03-06 ENCOUNTER — Ambulatory Visit (INDEPENDENT_AMBULATORY_CARE_PROVIDER_SITE_OTHER): Payer: No Payment, Other | Admitting: Licensed Clinical Social Worker

## 2021-03-06 DIAGNOSIS — F3130 Bipolar disorder, current episode depressed, mild or moderate severity, unspecified: Secondary | ICD-10-CM

## 2021-03-06 DIAGNOSIS — F411 Generalized anxiety disorder: Secondary | ICD-10-CM

## 2021-03-06 NOTE — Progress Notes (Signed)
THERAPIST PROGRESS NOTE    Virtual Visit via Video Note  I connected with Sara Pearson on 03/06/21 at 11:00 AM EDT by a video enabled telemedicine application and verified that I am speaking with the correct person using two identifiers.  Location: Patient: Sara Pearson  Provider: [facility]    I discussed the limitations of evaluation and management by telemedicine and the availability of in person appointments. The patient expressed understanding and agreed to proceed.     I discussed the assessment and treatment plan with the patient. The patient was provided an opportunity to ask questions and all were answered. The patient agreed with the plan and demonstrated an understanding of the instructions.   The patient was advised to call back or seek an in-person evaluation if the symptoms worsen or if the condition fails to improve as anticipated.  I provided 30 minutes of non-face-to-face time during this encounter.   Sara Cooks, LCSW    Participation Level: Active  Behavioral Response: CasualAlertAnxious  Type of Therapy: Individual Therapy  Treatment Goals addressed: Coping and Diagnosis: depression   Interventions: Motivational Interviewing and Supportive  Summary: Sara Pearson is a 35 y.o. female who presents with depressed and anxious mood\affect.  Patient was pleasant cooperative and maintained good eye contact.  Sara Pearson was alert and oriented x5.  Patient reports primary stressor is work.  She states that she could not talk much about it but was able to tell LCSW that she has applied for a new job.  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  Other stressors reported last time were family conflict as her grandmother in [place] was placed in a nursing home and patient was having limited contact with her due to a stroke.  Patient is increased talking to her family to 2-3 times  per week which she reports has decreased her depression and anxiety.Sara Pearson dates that her grandmother has been doing a lot better in the past 3 weeks.  Patient has also increased communication with her sister which she states "she is my best friend".   Patient reports stressors for financial, as evidenced by running out of Food and running out of money to pay for food.  This is the primary reason that she has reached out for other employment as she does not feel she is getting paid enough.  Patient reports that her primary financial backing has come from her father in law who has help supplement some of the food that is needed and goes over the regularly for dinners. Sara Pearson states that she is optimistic about this new job as her friend has contacted her to this lead.  Suicidal/Homicidal: NAwithout intent/plan  Therapist Response:    Intervention/Plan: Interventions utilized in today's session was supportive therapy for encouragement, praise, and advice giving.  Psychoanalytic therapy was utilized for free association to let patient states her thoughts feelings and emotions freely.  Cognitive behavioral therapy was utilized for open ended questions cognitive restructuring and reframing.  Patient reports an increase in the last 4 weeks of utilizing coping skills such as meditation and walking on a weekly basis.  Patient reports frequency of meditation 2 times per week and for walking 2 times per week.  Patient to continue to utilize these coping skills as it is decreased her depression and anxiety.  This is as evidenced by GAD-7 score today was a 9 last reported  score 4 weeks ago was a 17.  PHQ-9 was utilized in today's session last known score was 23 2 days score was a 5.  On top of coping skills utilized patient also reports that [place] nurse chart practitioner has increased her dose and Abilify and this has helped patient's mood stabilized.  Plan: Return again in 4 weeks.    Sara Cooks,  LCSW 03/06/2021

## 2021-03-08 ENCOUNTER — Other Ambulatory Visit: Payer: Self-pay

## 2021-03-15 ENCOUNTER — Other Ambulatory Visit: Payer: Self-pay

## 2021-03-22 ENCOUNTER — Other Ambulatory Visit: Payer: Self-pay

## 2021-04-03 ENCOUNTER — Other Ambulatory Visit: Payer: Self-pay

## 2021-04-03 ENCOUNTER — Ambulatory Visit (INDEPENDENT_AMBULATORY_CARE_PROVIDER_SITE_OTHER): Payer: No Payment, Other | Admitting: Licensed Clinical Social Worker

## 2021-04-03 DIAGNOSIS — F3175 Bipolar disorder, in partial remission, most recent episode depressed: Secondary | ICD-10-CM

## 2021-04-03 DIAGNOSIS — F411 Generalized anxiety disorder: Secondary | ICD-10-CM | POA: Diagnosis not present

## 2021-04-03 NOTE — Progress Notes (Signed)
   THERAPIST PROGRESS NOTE  Session Time: 25  Participation Level: Active  Behavioral Response: CasualAlertAnxious and Depressed  Type of Therapy: Individual Therapy  Treatment Goals addressed: Coping and Diagnosis: bipolar depression and anxiety   Interventions: CBT and Motivational Interviewing  Summary: Sara Pearson is a 35 y.o. female who presents with depressed and anxious mood\affect.  Patient was pleasant, cooperative, and maintained good eye contact.  Tacey Ruiz was alert and orientated x5.  Primary stressor today is patient's work.  Patient reports this edification with her current job as evidenced by the unhappiness with her boss.  Patient reports that her boss is not supportive with her mental health and continues to contact her during inappropriate work hours.  Tacey Ruiz reports that she has attempted to redirect this behavior from her boss and has been unsuccessful.  Tacey Ruiz reports that she endorses dissatisfaction, both motivation, unhappiness, and irritability due to her job.  LCSW and patient spoke about solutions to the problem.  Tacey Ruiz reports that she has interviewed at 2 separate locations.  One of the jobs did try to hire however she does not have a computer and they do not provide a computer.  Another job patient is interviewing for today at 6 PM as [employer].  LCSW also utilized solution focused therapy to provide patient resources to [place] works.   Suicidal/Homicidal: NAwithout intent/plan  Therapist Response:    Intervention/Plan: LCSW utilized supportive therapy, person centered therapy, and solution focused therapy.  Solution focused therapy is as evidenced by resources provided patient for job resources.  Supportive therapy and person centered therapy were used as evidenced LCSW using calm, genuineness, and encouragement for language used in session.  Plan for patient moving forward is to continue to utilize coping skills as reported meditation 2 times per week, walking 3 times a  week and journaling 3 times a week.  Coping skills are working as evidenced by GAD-7 score going from a 9 last session to a 4 in today's session.  And also as evidenced by PHQ-9 going from a four last session to a  two this session.   Plan: Return again in 4 weeks.     Weber Cooks, LCSW 04/03/2021

## 2021-04-05 ENCOUNTER — Other Ambulatory Visit: Payer: Self-pay

## 2021-04-16 ENCOUNTER — Other Ambulatory Visit: Payer: Self-pay

## 2021-04-19 ENCOUNTER — Other Ambulatory Visit: Payer: Self-pay

## 2021-04-24 ENCOUNTER — Telehealth: Payer: Self-pay

## 2021-04-24 ENCOUNTER — Ambulatory Visit: Payer: Medicaid Other | Admitting: Family Medicine

## 2021-04-24 NOTE — Telephone Encounter (Signed)
Attempted to call patient. Left voice message with [facility] contact information.

## 2021-04-26 ENCOUNTER — Other Ambulatory Visit: Payer: Self-pay

## 2021-05-07 ENCOUNTER — Ambulatory Visit (HOSPITAL_COMMUNITY): Payer: No Payment, Other | Admitting: Licensed Clinical Social Worker

## 2021-05-21 ENCOUNTER — Telehealth (INDEPENDENT_AMBULATORY_CARE_PROVIDER_SITE_OTHER): Payer: No Payment, Other | Admitting: Psychiatry

## 2021-05-21 ENCOUNTER — Other Ambulatory Visit: Payer: Self-pay

## 2021-05-21 ENCOUNTER — Encounter (HOSPITAL_COMMUNITY): Payer: Self-pay | Admitting: Psychiatry

## 2021-05-21 DIAGNOSIS — F4310 Post-traumatic stress disorder, unspecified: Secondary | ICD-10-CM | POA: Diagnosis not present

## 2021-05-21 DIAGNOSIS — F3130 Bipolar disorder, current episode depressed, mild or moderate severity, unspecified: Secondary | ICD-10-CM

## 2021-05-21 DIAGNOSIS — F411 Generalized anxiety disorder: Secondary | ICD-10-CM | POA: Diagnosis not present

## 2021-05-21 MED ORDER — TRAZODONE HCL 100 MG PO TABS
ORAL_TABLET | Freq: Every evening | ORAL | 3 refills | Status: DC | PRN
  Filled 2021-05-21: qty 30, 30d supply, fill #0

## 2021-05-21 MED ORDER — ARIPIPRAZOLE 15 MG PO TABS
15.0000 mg | ORAL_TABLET | Freq: Every day | ORAL | 3 refills | Status: DC
  Filled 2021-05-21: qty 30, 30d supply, fill #0
  Filled 2021-06-20: qty 30, 30d supply, fill #1
  Filled 2021-06-21: qty 30, 30d supply, fill #0
  Filled 2021-07-28: qty 30, 30d supply, fill #1

## 2021-05-21 MED ORDER — LAMOTRIGINE 25 MG PO TABS
25.0000 mg | ORAL_TABLET | Freq: Every day | ORAL | 3 refills | Status: DC
  Filled 2021-05-21: qty 30, 30d supply, fill #0
  Filled 2021-06-12: qty 30, 30d supply, fill #1
  Filled 2021-06-24: qty 30, 30d supply, fill #2
  Filled 2021-06-25 – 2021-06-28 (×2): qty 30, 30d supply, fill #0

## 2021-05-21 MED ORDER — BUPROPION HCL ER (XL) 150 MG PO TB24
ORAL_TABLET | Freq: Every morning | ORAL | 3 refills | Status: DC
  Filled 2021-05-21: qty 30, 30d supply, fill #0
  Filled 2021-06-19: qty 30, 30d supply, fill #1
  Filled 2021-06-20: qty 30, 30d supply, fill #0
  Filled 2021-07-28: qty 30, 30d supply, fill #1

## 2021-05-21 MED ORDER — PRAZOSIN HCL 1 MG PO CAPS
ORAL_CAPSULE | ORAL | 3 refills | Status: DC
  Filled 2021-05-21: qty 30, 30d supply, fill #0

## 2021-05-21 MED ORDER — HYDROXYZINE HCL 50 MG PO TABS
ORAL_TABLET | ORAL | 3 refills | Status: DC
Start: 2021-05-21 — End: 2021-08-08
  Filled 2021-05-21: qty 90, 30d supply, fill #0

## 2021-05-21 NOTE — Progress Notes (Signed)
BH MD/PA/NP OP Progress Note Virtual Visit via Video Note  I connected with Sara Pearson on 05/21/21 at  2:00 PM EST by a video enabled telemedicine application and verified that I am speaking with the correct person using two identifiers.  Location: Patient: Work Provider: Clinic   I discussed the limitations of evaluation and management by telemedicine and the availability of in person appointments. The patient expressed understanding and agreed to proceed.  I provided 30 minutes of non-face-to-face time during this encounter.    05/21/2021 2:39 PM Sara Pearson  MRN:  [id number]  Chief Complaint: "I have been off of Lamictal for a month"  HPI: 35 year old female seen today for followup psychiatric evaluation. She has a psychiatric history of Bipolar 1, anxiety, depression, and PTSD. She is currently managed on Trazodone 100 mg nightly, Abilify 15 mg daily, Lamictal 100 mg daily, prazosin 1 mg nightly, hydroxyzine 50 mg three times daily, and Wellbutrin XL 150 mg daily. She notes her medications are effective in managing her psychiatric conditions.  She notes that she has been out of Lamictal for a month but would like to restart it.  She also informed Clinical research associate that she has not taken trazodone in a while.   Today she is well-groomed, pleasant, cooperative, engaged in conversation, and maintained eye contact.  She informed provider that since being off Lamictal her mood has been somewhat stable however notes that she feels it could be better.  She notes at times she has irritability and fluctuations in mood but denies other symptoms of mania.  She informed Clinical research associate that her anxiety and depression continue to be minimal.  Provider conducted a GAD-7 and patient scored a 3, at her last visit she scored a 17.  Provider also conducted PHQ-9 and patient scored a 2, at her last visit she scored a 23.  She endorses adequate sleep since starting a new job at [employer] on third shift.  She also notes that  her appetite continues to be poor however informed writer that she eat to maintain her health.  She denies recent weight gain/weight loss.  Today she denies SI/HI/VAH or paranoia.    At this time patient is agreeable to restarting Lamictal to help manage mood.  She will take 25 mg for 2 weeks and increase her dose to 50 mg after 2 weeks.  Patient then instructed to increase from 50 to 75 mg 2 weeks after and then to 100 mg to help stabilize mood.  She will restart trazodone as needed to help with sleep.  No other medication changes made.  Patient agreeable to continue medications as prescribed.  She will follow-up with outpatient counseling for therapy.  No other concerns noted at this time.      Visit Diagnosis:    ICD-10-CM   1. Bipolar I disorder, most recent episode depressed (HCC)  F31.30 ARIPiprazole (ABILIFY) 15 MG tablet    buPROPion (WELLBUTRIN XL) 150 MG 24 hr tablet    lamoTRIgine (LAMICTAL) 25 MG tablet    traZODone (DESYREL) 100 MG tablet    2. GAD (generalized anxiety disorder)  F41.1 hydrOXYzine (ATARAX) 50 MG tablet    traZODone (DESYREL) 100 MG tablet    3. PTSD (post-traumatic stress disorder)  F43.10 prazosin (MINIPRESS) 1 MG capsule      Past Psychiatric History:  Bipolar 1, anxiety, depression, and PTSD.  Past Medical History:  Past Medical History:  Diagnosis Date   Bipolar 1 disorder (HCC)    Heel  spur    Hypertension    Osteogenesis imperfecta    PCOS (polycystic ovarian syndrome)    History reviewed. No pertinent surgical history.  Family Psychiatric History: Unknown    Family History: History reviewed. No pertinent family history.  Social History:  Social History   Socioeconomic History   Marital status: Married    Spouse name: Not on file   Number of children: Not on file   Years of education: Not on file   Highest education level: Not on file  Occupational History   Not on file  Tobacco Use   Smoking status: Never   Smokeless tobacco:  Never  Vaping Use   Vaping Use: Every day  Substance and Sexual Activity   Alcohol use: Yes   Drug use: Never   Sexual activity: Yes  Other Topics Concern   Not on file  Social History Narrative   Not on file   Social Determinants of Health   Financial Resource Strain: High Risk   Difficulty of Paying Living Expenses: Hard  Food Insecurity: Food Insecurity Present   Worried About Running Out of Food in the Last Year: Often true   Ran Out of Food in the Last Year: Sometimes true  Transportation Needs: No Transportation Needs   Lack of Transportation (Medical): No   Lack of Transportation (Non-Medical): No  Physical Activity: Not on file  Stress: Stress Concern Present   Feeling of Stress : To some extent  Social Connections: Moderately Isolated   Frequency of Communication with Friends and Family: More than three times a week   Frequency of Social Gatherings with Friends and Family: Once a week   Attends Religious Services: Never   Database administrator or Organizations: No   Attends Banker Meetings: Never   Marital Status: Married    Allergies:  Allergies  Allergen Reactions   Codeine    Iodine    Rocephin [Ceftriaxone Sodium In Dextrose]    Zofran [Ondansetron Hcl]     Metabolic Disorder Labs: Lab Results  Component Value Date   HGBA1C 4.8 10/22/2020   No results found for: PROLACTIN No results found for: CHOL, TRIG, HDL, CHOLHDL, VLDL, LDLCALC Lab Results  Component Value Date   TSH 0.580 10/22/2020    Therapeutic Level Labs: No results found for: LITHIUM Lab Results  Component Value Date   VALPROATE 27 (L) 05/04/2020   No components found for:  CBMZ  Current Medications: Current Outpatient Medications  Medication Sig Dispense Refill   lamoTRIgine (LAMICTAL) 25 MG tablet Take 1 tablet (25 mg total) by mouth daily. 120 tablet 3   ARIPiprazole (ABILIFY) 15 MG tablet Take 1 tablet (15 mg total) by mouth daily. 30 tablet 3   buPROPion  (WELLBUTRIN XL) 150 MG 24 hr tablet TAKE 1 TABLET (150 MG TOTAL) BY MOUTH EVERY MORNING. 30 tablet 3   hydrOXYzine (ATARAX) 50 MG tablet TAKE 1 TABLET (50 MG TOTAL) BY MOUTH 3 (THREE) TIMES DAILY AS NEEDED. 90 tablet 3   metoprolol succinate (TOPROL-XL) 50 MG 24 hr tablet Take 1 tablet (50 mg total) by mouth daily. Take with or immediately following a meal. 30 tablet 6   Norgestimate-Ethinyl Estradiol Triphasic (ORTHO TRI-CYCLEN LO) 0.18/0.215/0.25 MG-25 MCG tab Take 1 tablet by mouth daily. 28 tablet 11   prazosin (MINIPRESS) 1 MG capsule TAKE 1 CAPSULE (1 MG TOTAL) BY MOUTH AT BEDTIME. 30 capsule 3   traZODone (DESYREL) 100 MG tablet TAKE 1 TABLET (100 MG TOTAL) BY  MOUTH AT BEDTIME AS NEEDED FOR SLEEP. 30 tablet 3   No current facility-administered medications for this visit.     Musculoskeletal: Strength & Muscle Tone:  Unable to assess due to telehealth visit Gait & Station:  Unable to assess due to telehealth visit Patient leans: N/A  Psychiatric Specialty Exam: Review of Systems  There were no vitals taken for this visit.There is no height or weight on file to calculate BMI.  General Appearance: Well Groomed  Eye Contact:  Good  Speech:  Clear and Coherent and Normal Rate  Volume:  Normal  Mood:  Euthymic  Affect:  Appropriate and Congruent  Thought Process:  Coherent, Goal Directed and Linear  Orientation:  Full (Time, Place, and Person)  Thought Content: WDL and Logical   Suicidal Thoughts:  No  Homicidal Thoughts:  No  Memory:  Immediate;   Good Recent;   Good Remote;   Good  Judgement:  Good  Insight:  Good  Psychomotor Activity:  Normal  Concentration:  Concentration: Good and Attention Span: Good  Recall:  Good  Fund of Knowledge: Good  Language: Good  Akathisia:  No  Handed:  Right  AIMS (if indicated): Not done  Assets:  Communication Skills Desire for Improvement Financial Resources/Insurance Housing Intimacy Social Support  ADL's:  Intact  Cognition:  WNL  Sleep:  Good   Screenings: AUDIT    Advertising copywriter from 11/28/2020 in [facility] Counselor from 04/16/2020 in [facility]  Alcohol Use Disorder Identification Test Final Score (AUDIT) 6 8      GAD-7    Flowsheet Row Video Visit from 05/21/2021 in [facility] Counselor from 04/03/2021 in [facility] Counselor from 03/06/2021 in [facility] Video Visit from 02/12/2021 in [facility] Counselor from 01/23/2021 in [facility]  Total GAD-7 Score 3 4 9 17 13       PHQ2-9    Flowsheet Row Video Visit from 05/21/2021 in [facility] Counselor from 04/03/2021 in [facility] Counselor from 03/06/2021 in [facility] Video Visit from 02/12/2021 in [facility] Counselor from 01/23/2021 in [facility]  PHQ-2 Total Score 0 0 1 5 2   PHQ-9 Total Score 2 2 4 23 5       Flowsheet Row Video Visit from 05/21/2021 in [facility] Video Visit from 02/12/2021 in [facility] Video Visit from 08/17/2020 in [facility]  C-SSRS RISK CATEGORY Error: Q3, 4, or 5 should not be populated when Q2 is No Error: Q7 should not be populated when Q6 is No Error: Question 2 not populated        Assessment and Plan: Patient notes that her anxiety and depression are well managed.  She however notes that since being off the Lamictal her mood at times fluctuates and she is irritable.  At this time patient is agreeable to restarting Lamictal to help manage mood.  She will take 25 mg for 2 weeks and increase her dose to 50 mg after 2 weeks.  Patient then instructed to increase from 50 to 75 mg 2 weeks  after and then to 100 mg to help stabilize mood.  She will restart trazodone as needed to help with sleep.  No other medication changes made.  Patient agreeable to continue  medications as prescribed.   1. Bipolar I disorder, most recent episode depressed (HCC)  Continue- ARIPiprazole (ABILIFY) 15 MG tablet; Take 1 tablet (15 mg total) by mouth daily.  Dispense: 30 tablet; Refill: 3 Continue- buPROPion (WELLBUTRIN XL) 150 MG 24 hr tablet; TAKE 1 TABLET (150 MG TOTAL) BY MOUTH EVERY MORNING.  Dispense: 30 tablet; Refill: 3 Restart- traZODone (DESYREL) 100 MG tablet; TAKE 1 TABLET (100 MG TOTAL) BY MOUTH AT BEDTIME AS NEEDED FOR SLEEP.  Dispense: 30 tablet; Refill: 3 Restart- lamoTRIgine (LAMICTAL) 25 MG tablet; Take 1 tablet (25 mg total) by mouth daily.  Dispense: 120 tablet; Refill: 3  2. GAD (generalized anxiety disorder)  Continue- hydrOXYzine (ATARAX) 50 MG tablet; TAKE 1 TABLET (50 MG TOTAL) BY MOUTH 3 (THREE) TIMES DAILY AS NEEDED.  Dispense: 90 tablet; Refill: 3 Restart- traZODone (DESYREL) 100 MG tablet; TAKE 1 TABLET (100 MG TOTAL) BY MOUTH AT BEDTIME AS NEEDED FOR SLEEP.  Dispense: 30 tablet; Refill: 3  3. PTSD (post-traumatic stress disorder)  Continue- prazosin (MINIPRESS) 1 MG capsule; TAKE 1 CAPSULE (1 MG TOTAL) BY MOUTH AT BEDTIME.  Dispense: 30 capsule; Refill: 3        Follow-up in 3 months Follow-up with therapy  Shanna Cisco, NP 05/21/2021, 2:39 PM

## 2021-05-24 ENCOUNTER — Other Ambulatory Visit: Payer: Self-pay

## 2021-05-27 ENCOUNTER — Ambulatory Visit (HOSPITAL_COMMUNITY): Payer: No Payment, Other | Admitting: Licensed Clinical Social Worker

## 2021-06-13 ENCOUNTER — Other Ambulatory Visit: Payer: Self-pay

## 2021-06-14 ENCOUNTER — Other Ambulatory Visit: Payer: Self-pay

## 2021-06-21 ENCOUNTER — Other Ambulatory Visit: Payer: Self-pay

## 2021-06-22 ENCOUNTER — Other Ambulatory Visit: Payer: Self-pay

## 2021-06-24 ENCOUNTER — Other Ambulatory Visit: Payer: Self-pay

## 2021-06-25 ENCOUNTER — Other Ambulatory Visit: Payer: Self-pay

## 2021-06-26 ENCOUNTER — Ambulatory Visit (INDEPENDENT_AMBULATORY_CARE_PROVIDER_SITE_OTHER): Payer: No Payment, Other | Admitting: Licensed Clinical Social Worker

## 2021-06-26 DIAGNOSIS — F3175 Bipolar disorder, in partial remission, most recent episode depressed: Secondary | ICD-10-CM

## 2021-06-26 NOTE — Progress Notes (Signed)
° °  THERAPIST PROGRESS NOTE  Virtual Visit via Video Note  I connected with Sara Pearson on 06/26/21 at  4:00 PM EST by a video enabled telemedicine application and verified that I am speaking with the correct person using two identifiers.  Location: Patient: [facility]  Provider: [facility]    I discussed the limitations of evaluation and management by telemedicine and the availability of in person appointments. The patient expressed understanding and agreed to proceed.     I discussed the assessment and treatment plan with the patient. The patient was provided an opportunity to ask questions and all were answered. The patient agreed with the plan and demonstrated an understanding of the instructions.   The patient was advised to call back or seek an in-person evaluation if the symptoms worsen or if the condition fails to improve as anticipated.  I provided 30 minutes of non-face-to-face time during this encounter.   Weber Cooks, LCSW   Participation Level: Active  Behavioral Response: Casual and Fairly GroomedAlertAnxious and Depressed  Type of Therapy: Individual Therapy  Treatment Goals addressed: Anxiety and Coping  Interventions: CBT, Motivational Interviewing, and Supportive  Summary: Sara Pearson is a 36 y.o. female who presents with euthymic mood\affect.  Patient was pleasant, cooperative, and maintained good eye contact.  She was dressed casually and engaged well in therapy session.  Patient reports that things have been going "overall very well".  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  She reports that she enjoys everyone that she works with and also her Production designer, theatre/television/film.  She reports that there was some lows 3 months ago when she quit her previous job due to her boss not taking her resignation kindly.  Patient reports that she was so upset that she walked out without any notice.  Patient reports that that is  all in the past now and that she is really enjoying her new job.  This is as evidenced by utilizing coping skills such as meditation, journaling, and walking.  Patient reports that she is walking up to 6 miles in 1 day with her new job per her Fitbit.  Patient reports that she is also utilizing meditation and journaling multiple times per week.  Patient reports that things are going overall well with her spouse and that "he has more good days than he does bad days".  Patient is referring to his physical illness.  Suicidal/Homicidal: Nowithout intent/plan  Therapist Response:    Intervention/Plan: LCSW administered a GAD-7 and a PHQ-9 in today's session patient scored a 0 this is a decrease of 3 points for GAD-7 and 2 points for her PHQ-9 from last time it was administered on May 29, 2021.  Patient's goal\objective was always to remain below a 5 or more than 2 sessions.  Patient will increase follow-up appointments to 5 to 6 weeks out instead of 4 weeks out due to her improvement.  LCSW will continue to administer GAD-7 and PHQ-9 over the next 2 sessions if they remain below a 5 patient will be able to discharge from therapy.  LCSW utilized in today's session motivational interviewing, supportive therapy, and person centered therapy.  LCSW empowered patient as well is utilized Copywriter, advertising and encouragement in today's session.  LCSW also utilized positive affirmations.  Plan: Return again in 4 weeks.      Weber Cooks, LCSW 06/26/2021

## 2021-06-28 ENCOUNTER — Other Ambulatory Visit: Payer: Self-pay

## 2021-06-28 ENCOUNTER — Other Ambulatory Visit (HOSPITAL_COMMUNITY): Payer: Self-pay | Admitting: Psychiatry

## 2021-06-28 ENCOUNTER — Telehealth (HOSPITAL_COMMUNITY): Payer: Self-pay | Admitting: *Deleted

## 2021-06-28 DIAGNOSIS — F3130 Bipolar disorder, current episode depressed, mild or moderate severity, unspecified: Secondary | ICD-10-CM

## 2021-06-28 MED ORDER — LAMOTRIGINE 100 MG PO TABS
100.0000 mg | ORAL_TABLET | Freq: Every day | ORAL | 3 refills | Status: DC
  Filled 2021-06-28: qty 30, 30d supply, fill #0
  Filled 2021-07-28: qty 30, 30d supply, fill #1

## 2021-06-28 NOTE — Telephone Encounter (Signed)
Provider called patient who notes that she is on 75 mg and has  been for over a week. She notes that her mood is stable. Today provider refilled Lamictal 100 mg to be taken daily. Medication reordered and sent to [facility].

## 2021-06-28 NOTE — Telephone Encounter (Signed)
Call from patient stating she is having difficulty getting her Lamictal from the pharmacy. Rx looks correct, last note from provider states she is to increase her Lamictal over time to 100 mg but community pharmacy rx is for her to take 25 mg a day and they wont release it early. Will notify Dr Ronne Binning to call in to community pharmacy a new rx for her Lamictal to reflect current sig.

## 2021-07-25 ENCOUNTER — Ambulatory Visit: Payer: Medicaid Other | Admitting: Family Medicine

## 2021-07-29 ENCOUNTER — Other Ambulatory Visit: Payer: Self-pay

## 2021-08-02 ENCOUNTER — Other Ambulatory Visit: Payer: Self-pay

## 2021-08-02 ENCOUNTER — Ambulatory Visit (HOSPITAL_COMMUNITY): Payer: No Payment, Other | Admitting: Licensed Clinical Social Worker

## 2021-08-08 ENCOUNTER — Other Ambulatory Visit: Payer: Self-pay

## 2021-08-08 ENCOUNTER — Encounter (HOSPITAL_COMMUNITY): Payer: Self-pay | Admitting: Psychiatry

## 2021-08-08 ENCOUNTER — Telehealth (INDEPENDENT_AMBULATORY_CARE_PROVIDER_SITE_OTHER): Payer: No Payment, Other | Admitting: Psychiatry

## 2021-08-08 DIAGNOSIS — F4310 Post-traumatic stress disorder, unspecified: Secondary | ICD-10-CM | POA: Diagnosis not present

## 2021-08-08 DIAGNOSIS — F3130 Bipolar disorder, current episode depressed, mild or moderate severity, unspecified: Secondary | ICD-10-CM

## 2021-08-08 DIAGNOSIS — F411 Generalized anxiety disorder: Secondary | ICD-10-CM | POA: Diagnosis not present

## 2021-08-08 MED ORDER — ARIPIPRAZOLE 15 MG PO TABS
15.0000 mg | ORAL_TABLET | Freq: Every day | ORAL | 3 refills | Status: DC
  Filled 2021-08-08 – 2021-09-01 (×2): qty 30, 30d supply, fill #0
  Filled 2021-10-03: qty 30, 30d supply, fill #1
  Filled 2021-11-08: qty 30, 30d supply, fill #2
  Filled 2021-12-08: qty 30, 30d supply, fill #3

## 2021-08-08 MED ORDER — PRAZOSIN HCL 1 MG PO CAPS
ORAL_CAPSULE | ORAL | 3 refills | Status: DC
  Filled 2021-08-08: qty 30, 30d supply, fill #0

## 2021-08-08 MED ORDER — BUPROPION HCL ER (XL) 150 MG PO TB24
ORAL_TABLET | Freq: Every morning | ORAL | 3 refills | Status: DC
  Filled 2021-08-08: qty 30, fill #0
  Filled 2021-09-01: qty 90, 90d supply, fill #0
  Filled 2021-12-08: qty 30, 30d supply, fill #1

## 2021-08-08 MED ORDER — HYDROXYZINE HCL 50 MG PO TABS
ORAL_TABLET | ORAL | 3 refills | Status: DC
  Filled 2021-08-08: qty 90, 30d supply, fill #0

## 2021-08-08 MED ORDER — LAMOTRIGINE 100 MG PO TABS
100.0000 mg | ORAL_TABLET | Freq: Every day | ORAL | 3 refills | Status: DC
  Filled 2021-08-08 – 2021-09-01 (×2): qty 30, 30d supply, fill #0
  Filled 2021-10-03: qty 30, 30d supply, fill #1
  Filled 2021-11-08: qty 30, 30d supply, fill #2
  Filled 2021-12-08: qty 30, 30d supply, fill #3

## 2021-08-08 MED ORDER — TRAZODONE HCL 100 MG PO TABS
ORAL_TABLET | Freq: Every evening | ORAL | 3 refills | Status: DC | PRN
  Filled 2021-08-08 – 2021-09-01 (×2): qty 30, 30d supply, fill #0
  Filled 2021-10-03: qty 30, 30d supply, fill #1

## 2021-08-08 NOTE — Progress Notes (Signed)
BH MD/PA/NP OP Progress Note Virtual Visit via Telephone Note  I connected with Sara Pearson on 08/08/21 at  4:00 PM EST by telephone and verified that I am speaking with the correct person using two identifiers.  Location: Patient: home Provider: Clinic   I discussed the limitations, risks, security and privacy concerns of performing an evaluation and management service by telephone and the availability of in person appointments. I also discussed with the patient that there may be a patient responsible charge related to this service. The patient expressed understanding and agreed to proceed.   I provided 30 minutes of non-face-to-face time during this encounter.     08/08/2021 11:48 AM Sara Pearson  MRN:  [id number]  Chief Complaint: "I feeling amazing"  HPI: 36 year old female seen today for followup psychiatric evaluation. She has a psychiatric history of Bipolar 1, anxiety, depression, and PTSD. She is currently managed on Trazodone 100 mg nightly, Abilify 15 mg daily, Lamictal 100 mg daily, prazosin 1 mg nightly, hydroxyzine 50 mg three times daily, and Wellbutrin XL 150 mg daily. She notes her medications are effective in managing her psychiatric conditions.     Today she was unable to logon virtually so assessment was done over the phone.  During exam she was pleasant, cooperative, and engaged in conversation.  She informed provider that since being back on Lamictal she feels amazing.  She notes her mood is stable and reports that she has very minimal anxiety and depression.  Provider conducted a GAD-7 and patient scored a 1, at her last visit she scored a 3.  Provider also conducted PHQ-9 patient scored a 0, at her last visit she scored a 2.  She endorses adequate sleep and appetite. Today she denies SI/HI/VAH, mania, or paranoia.    No medication changes made today.  Patient agreeable to continue medications as prescribed.  She will follow-up with outpatient counseling for  therapy.  No other concerns noted at this time.      Visit Diagnosis:    ICD-10-CM   1. Bipolar I disorder, most recent episode depressed (HCC)  F31.30 ARIPiprazole (ABILIFY) 15 MG tablet    buPROPion (WELLBUTRIN XL) 150 MG 24 hr tablet    lamoTRIgine (LAMICTAL) 100 MG tablet    traZODone (DESYREL) 100 MG tablet    2. GAD (generalized anxiety disorder)  F41.1 hydrOXYzine (ATARAX) 50 MG tablet    traZODone (DESYREL) 100 MG tablet    3. PTSD (post-traumatic stress disorder)  F43.10 prazosin (MINIPRESS) 1 MG capsule      Past Psychiatric History:  Bipolar 1, anxiety, depression, and PTSD.  Past Medical History:  Past Medical History:  Diagnosis Date   Bipolar 1 disorder (HCC)    Heel spur    Hypertension    Osteogenesis imperfecta    PCOS (polycystic ovarian syndrome)    History reviewed. No pertinent surgical history.  Family Psychiatric History: Unknown    Family History: History reviewed. No pertinent family history.  Social History:  Social History   Socioeconomic History   Marital status: Married    Spouse name: Not on file   Number of children: Not on file   Years of education: Not on file   Highest education level: Not on file  Occupational History   Not on file  Tobacco Use   Smoking status: Never   Smokeless tobacco: Never  Vaping Use   Vaping Use: Every day  Substance and Sexual Activity   Alcohol use: Yes  Drug use: Never   Sexual activity: Yes  Other Topics Concern   Not on file  Social History Narrative   Not on file   Social Determinants of Health   Financial Resource Strain: High Risk   Difficulty of Paying Living Expenses: Hard  Food Insecurity: Food Insecurity Present   Worried About Running Out of Food in the Last Year: Often true   Ran Out of Food in the Last Year: Sometimes true  Transportation Needs: No Transportation Needs   Lack of Transportation (Medical): No   Lack of Transportation (Non-Medical): No  Physical Activity:  Not on file  Stress: Stress Concern Present   Feeling of Stress : To some extent  Social Connections: Moderately Isolated   Frequency of Communication with Friends and Family: More than three times a week   Frequency of Social Gatherings with Friends and Family: Once a week   Attends Religious Services: Never   Database administratorActive Member of Clubs or Organizations: No   Attends BankerClub or Organization Meetings: Never   Marital Status: Married    Allergies:  Allergies  Allergen Reactions   Codeine    Iodine    Rocephin [Ceftriaxone Sodium In Dextrose]    Zofran [Ondansetron Hcl]     Metabolic Disorder Labs: Lab Results  Component Value Date   HGBA1C 4.8 10/22/2020   No results found for: PROLACTIN No results found for: CHOL, TRIG, HDL, CHOLHDL, VLDL, LDLCALC Lab Results  Component Value Date   TSH 0.580 10/22/2020    Therapeutic Level Labs: No results found for: LITHIUM Lab Results  Component Value Date   VALPROATE 27 (L) 05/04/2020   No components found for:  CBMZ  Current Medications: Current Outpatient Medications  Medication Sig Dispense Refill   ARIPiprazole (ABILIFY) 15 MG tablet Take 1 tablet (15 mg total) by mouth daily. 30 tablet 3   buPROPion (WELLBUTRIN XL) 150 MG 24 hr tablet TAKE 1 TABLET (150 MG TOTAL) BY MOUTH EVERY MORNING. 30 tablet 3   hydrOXYzine (ATARAX) 50 MG tablet TAKE 1 TABLET (50 MG TOTAL) BY MOUTH 3 (THREE) TIMES DAILY AS NEEDED. 90 tablet 3   lamoTRIgine (LAMICTAL) 100 MG tablet Take 1 tablet (100 mg total) by mouth daily. 30 tablet 3   metoprolol succinate (TOPROL-XL) 50 MG 24 hr tablet Take 1 tablet (50 mg total) by mouth daily. Take with or immediately following a meal. 30 tablet 6   Norgestimate-Ethinyl Estradiol Triphasic (ORTHO TRI-CYCLEN LO) 0.18/0.215/0.25 MG-25 MCG tab Take 1 tablet by mouth daily. 28 tablet 11   prazosin (MINIPRESS) 1 MG capsule TAKE 1 CAPSULE (1 MG TOTAL) BY MOUTH AT BEDTIME. 30 capsule 3   traZODone (DESYREL) 100 MG tablet TAKE  1 TABLET (100 MG TOTAL) BY MOUTH AT BEDTIME AS NEEDED FOR SLEEP. 30 tablet 3   No current facility-administered medications for this visit.     Musculoskeletal: Strength & Muscle Tone:  Unable to assess due to telephone visit Gait & Station:  Unable to assess due to telephone visit Patient leans: N/A  Psychiatric Specialty Exam: Review of Systems  There were no vitals taken for this visit.There is no height or weight on file to calculate BMI.  General Appearance:  Unable to assess due to telephone visit  Eye Contact:   Unable to assess due to telephone visit  Speech:  Clear and Coherent and Normal Rate  Volume:  Normal  Mood:  Euthymic  Affect:  Appropriate and Congruent  Thought Process:  Coherent, Goal Directed and Linear  Orientation:  Full (Time, Place, and Person)  Thought Content: WDL and Logical   Suicidal Thoughts:  No  Homicidal Thoughts:  No  Memory:  Immediate;   Good Recent;   Good Remote;   Good  Judgement:  Good  Insight:  Good  Psychomotor Activity:  Unable to assess due to telephone visit  Concentration:  Concentration: Good and Attention Span: Good  Recall:  Good  Fund of Knowledge: Good  Language: Good  Akathisia:  Unable to assess due to telephone visit  Handed:  Right  AIMS (if indicated): Not done, telephone visit  Assets:  Communication Skills Desire for Improvement Financial Resources/Insurance Housing Intimacy Social Support  ADL's:  Intact  Cognition: WNL  Sleep:  Good   Screenings: AUDIT    Advertising copywriter from 11/28/2020 in [facility] Counselor from 04/16/2020 in [facility]  Alcohol Use Disorder Identification Test Final Score (AUDIT) 6 8      GAD-7    Flowsheet Row Video Visit from 08/08/2021 in [facility] Counselor from 06/26/2021 in [facility] Video Visit from 05/21/2021 in [facility] Counselor from 04/03/2021 in [facility] Counselor from 03/06/2021 in [facility]  Total GAD-7 Score 1 0 3 4 9       PHQ2-9    Flowsheet Row Video Visit from 08/08/2021 in [facility] Counselor from 06/26/2021 in [facility] Video Visit from 05/21/2021 in [facility] Counselor from 04/03/2021 in [facility] Counselor from 03/06/2021 in [facility]  PHQ-2 Total Score 0 0 0 0 1  PHQ-9 Total Score 0 0 2 2 4       Flowsheet Row Video Visit from 08/08/2021 in [facility] Video Visit from 05/21/2021 in [facility] Video Visit from 02/12/2021 in [facility]  C-SSRS RISK CATEGORY Error: Question 6 not populated Error: Q3, 4, or 5 should not be populated when Q2 is No Error: Q7 should not be populated when Q6 is No        Assessment and Plan: Patient notes that she is doing well on her current medication regimen.  No medication changes made today.  Patient agreeable to continue medications as prescribed  1. Bipolar I disorder, most recent episode depressed (HCC)  Continue- ARIPiprazole (ABILIFY) 15 MG tablet; Take 1 tablet (15 mg total) by mouth daily.  Dispense: 30 tablet; Refill: 3 Continue- buPROPion (WELLBUTRIN XL) 150 MG 24 hr tablet; TAKE 1 TABLET (150 MG TOTAL) BY MOUTH EVERY MORNING.  Dispense: 30 tablet; Refill: 3 Continue- lamoTRIgine (LAMICTAL) 100 MG tablet; Take 1 tablet (100 mg total) by mouth daily.  Dispense: 30 tablet; Refill: 3 Continue- traZODone (DESYREL) 100 MG tablet; TAKE 1 TABLET (100 MG TOTAL) BY MOUTH AT BEDTIME AS NEEDED FOR SLEEP.  Dispense: 30 tablet; Refill: 3  2. GAD (generalized anxiety disorder)  Continue- hydrOXYzine (ATARAX) 50 MG tablet; TAKE 1 TABLET (50 MG TOTAL) BY MOUTH 3  (THREE) TIMES DAILY AS NEEDED.  Dispense: 90 tablet; Refill: 3 Continue- traZODone (DESYREL) 100 MG tablet; TAKE 1 TABLET (100 MG TOTAL) BY MOUTH AT BEDTIME AS NEEDED FOR SLEEP.  Dispense: 30 tablet; Refill: 3  3. PTSD (post-traumatic stress disorder)  Continue- prazosin (MINIPRESS) 1 MG capsule; TAKE 1 CAPSULE (1 MG TOTAL) BY MOUTH AT BEDTIME.  Dispense: 30 capsule; Refill: 3       Follow-up in 3 months Follow-up with therapy  Shanna Cisco, NP 08/08/2021, 11:48 AM

## 2021-08-15 ENCOUNTER — Other Ambulatory Visit: Payer: Self-pay

## 2021-09-02 ENCOUNTER — Other Ambulatory Visit: Payer: Self-pay

## 2021-09-04 ENCOUNTER — Other Ambulatory Visit: Payer: Self-pay

## 2021-10-03 ENCOUNTER — Other Ambulatory Visit: Payer: Self-pay

## 2021-10-04 ENCOUNTER — Other Ambulatory Visit: Payer: Self-pay

## 2021-10-24 ENCOUNTER — Telehealth (HOSPITAL_COMMUNITY): Payer: No Payment, Other | Admitting: Psychiatry

## 2021-10-24 ENCOUNTER — Telehealth (INDEPENDENT_AMBULATORY_CARE_PROVIDER_SITE_OTHER): Payer: No Payment, Other | Admitting: Psychiatry

## 2021-10-24 DIAGNOSIS — F3130 Bipolar disorder, current episode depressed, mild or moderate severity, unspecified: Secondary | ICD-10-CM

## 2021-10-24 DIAGNOSIS — F411 Generalized anxiety disorder: Secondary | ICD-10-CM | POA: Diagnosis not present

## 2021-10-24 NOTE — Progress Notes (Signed)
BH MD/PA/NP OP Progress Note ? ?10/24/2021 8:06 PM ?Sara Pearson  ?MRN:  [id number] ? ?Virtual Visit via Video Note ? ?I connected with Sara NovelLia Jade Sakuma on 10/24/21 at  5:30 PM EDT by a video enabled telemedicine application and verified that I am speaking with the correct person using two identifiers. ? ?Location: ?Patient: home ?Provider: offsite ?  ?I discussed the limitations of evaluation and management by telemedicine and the availability of in person appointments. The patient expressed understanding and agreed to proceed. ? ? ?  ?I discussed the assessment and treatment plan with the patient. The patient was provided an opportunity to ask questions and all were answered. The patient agreed with the plan and demonstrated an understanding of the instructions. ?  ?The patient was advised to call back or seek an in-person evaluation if the symptoms worsen or if the condition fails to improve as anticipated. ? ?I provided 5 minutes of non-face-to-face time during this encounter. ? ? ?Mcneil Sobericely Iyonna Rish, NP  ? ?Chief Complaint: Medication management ? ?HPI: Sara Pearson is a 36 year old female [facility] follow-up psychiatric evaluation.  She has a psychiatric history of bipolar disorder and generalized anxiety disorder and PTSD.  Patient symptoms are managed with trazodone 100 mg at bedtime as needed for sleep, prazosin 1 mg at bedtime, Lamictal 100 mg daily, hydroxyzine 50 mg 3 times daily as needed for anxiety, Wellbutrin 150 mg daily, and Abilify 15 mg daily.  Patient reports that medications are effective for managing symptoms and she is medication compliant.  Patient denies adverse medication effects or need for dosage adjustment today.  No medication changes today. ? ? ?Visit Diagnosis:  ?  ICD-10-CM   ?1. Bipolar I disorder, most recent episode depressed (HCC)  F31.30   ?  ?2. GAD (generalized anxiety disorder)  F41.1   ?  ? ? ?Past Psychiatric History: Bipolar  disorder, PTSD and generalized anxiety disorder ? ?Past Medical History:  ?Past Medical History:  ?Diagnosis Date  ? Bipolar 1 disorder (HCC)   ? Heel spur   ? Hypertension   ? Osteogenesis imperfecta   ? PCOS (polycystic ovarian syndrome)   ? No past surgical history on file. ? ?Family Psychiatric History: N/A ? ?Family History: No family history on file. ? ?Social History:  ?Social History  ? ?Socioeconomic History  ? Marital status: Married  ?  Spouse name: Not on file  ? Number of children: Not on file  ? Years of education: Not on file  ? Highest education level: Not on file  ?Occupational History  ? Not on file  ?Tobacco Use  ? Smoking status: Never  ? Smokeless tobacco: Never  ?Vaping Use  ? Vaping Use: Every day  ?Substance and Sexual Activity  ? Alcohol use: Yes  ? Drug use: Never  ? Sexual activity: Yes  ?Other Topics Concern  ? Not on file  ?Social History Narrative  ? Not on file  ? ?Social Determinants of Health  ? ?Financial Resource Strain: High Risk  ? Difficulty of Paying Living Expenses: Hard  ?Food Insecurity: Food Insecurity Present  ? Worried About Programme researcher, broadcasting/film/videounning Out of Food in the Last Year: Often true  ? Ran Out of Food in the Last Year: Sometimes true  ?Transportation Needs: No Transportation Needs  ? Lack of Transportation (Medical): No  ? Lack of Transportation (Non-Medical): No  ?Physical Activity: Not on file  ?Stress: Stress Concern Present  ? Feeling of Stress :  To some extent  ?Social Connections: Moderately Isolated  ? Frequency of Communication with Friends and Family: More than three times a week  ? Frequency of Social Gatherings with Friends and Family: Once a week  ? Attends Religious Services: Never  ? Active Member of Clubs or Organizations: No  ? Attends Banker Meetings: Never  ? Marital Status: Married  ? ? ?Allergies:  ?Allergies  ?Allergen Reactions  ? Codeine   ? Iodine   ? Rocephin [Ceftriaxone Sodium In Dextrose]   ? Zofran [Ondansetron Hcl]   ? ? ?Metabolic  Disorder Labs: ?Lab Results  ?Component Value Date  ? HGBA1C 4.8 10/22/2020  ? ?No results found for: PROLACTIN ?No results found for: CHOL, TRIG, HDL, CHOLHDL, VLDL, LDLCALC ?Lab Results  ?Component Value Date  ? TSH 0.580 10/22/2020  ? ? ?Therapeutic Level Labs: ?No results found for: LITHIUM ?Lab Results  ?Component Value Date  ? VALPROATE 27 (L) 05/04/2020  ? ?No components found for:  CBMZ ? ?Current Medications: ?Current Outpatient Medications  ?Medication Sig Dispense Refill  ? ARIPiprazole (ABILIFY) 15 MG tablet Take 1 tablet (15 mg total) by mouth daily. 30 tablet 3  ? buPROPion (WELLBUTRIN XL) 150 MG 24 hr tablet TAKE 1 TABLET (150 MG TOTAL) BY MOUTH EVERY MORNING. 30 tablet 3  ? hydrOXYzine (ATARAX) 50 MG tablet TAKE 1 TABLET (50 MG TOTAL) BY MOUTH 3 (THREE) TIMES DAILY AS NEEDED. 90 tablet 3  ? lamoTRIgine (LAMICTAL) 100 MG tablet Take 1 tablet (100 mg total) by mouth daily. 30 tablet 3  ? metoprolol succinate (TOPROL-XL) 50 MG 24 hr tablet Take 1 tablet (50 mg total) by mouth daily. Take with or immediately following a meal. 30 tablet 6  ? Norgestimate-Ethinyl Estradiol Triphasic (ORTHO TRI-CYCLEN LO) 0.18/0.215/0.25 MG-25 MCG tab Take 1 tablet by mouth daily. 28 tablet 11  ? prazosin (MINIPRESS) 1 MG capsule TAKE 1 CAPSULE (1 MG TOTAL) BY MOUTH AT BEDTIME. 30 capsule 3  ? traZODone (DESYREL) 100 MG tablet TAKE 1 TABLET (100 MG TOTAL) BY MOUTH AT BEDTIME AS NEEDED FOR SLEEP. 30 tablet 3  ? ?No current facility-administered medications for this visit.  ? ? ? ?Musculoskeletal: ?Strength & Muscle Tone: N/A virtual visit ?Gait & Station: N/A ?Patient leans: N/A ? ?Psychiatric Specialty Exam: ?Review of Systems  ?Psychiatric/Behavioral:  Negative for hallucinations, self-injury, sleep disturbance and suicidal ideas. The patient is not hyperactive.   ?All other systems reviewed and are negative.  ?There were no vitals taken for this visit.There is no height or weight on file to calculate BMI.  ?General  Appearance: NA  ?Eye Contact:  NA  ?Speech:  Clear and Coherent  ?Volume:  Normal  ?Mood:  Euthymic  ?Affect:  NA  ?Thought Process:  Coherent  ?Orientation:  Full (Time, Place, and Person)  ?Thought Content: Logical   ?Suicidal Thoughts:  No  ?Homicidal Thoughts:  No  ?Memory: Good  ?Judgement:  Good  ?Insight:  Good  ?Psychomotor Activity:  Normal  ?Concentration: Good  ?Recall:  Good  ?Fund of Knowledge: Good  ?Language: Good  ?Akathisia:  NA  ?Handed:  Right  ?AIMS (if indicated): not done  ?Assets:  Communication Skills ?Desire for Improvement  ?ADL's:  Intact  ?Cognition: WNL  ?Sleep:  Good  ? ?Screenings: ?AUDIT   ? ?Flowsheet Row Counselor from 11/28/2020 in [facility] Counselor from 04/16/2020 in [facility]  ?Alcohol Use Disorder Identification Test Final Score (AUDIT) 6 8  ? ?  ? ?  GAD-7   ? ?Flowsheet Row Video Visit from 08/08/2021 in [facility] Counselor from 06/26/2021 in [facility] Video Visit from 05/21/2021 in [facility] Counselor from 04/03/2021 in [facility] Counselor from 03/06/2021 in [facility]  ?Total GAD-7 Score 1 0 3 4 9   ? ?  ? ?PHQ2-9   ? ?Flowsheet Row Video Visit from 08/08/2021 in [facility] Counselor from 06/26/2021 in [facility] Video Visit from 05/21/2021 in [facility] Counselor from 04/03/2021 in [facility] Counselor from 03/06/2021 in [facility]  ?PHQ-2 Total Score 0 0 0 0 1  ?PHQ-9 Total Score 0 0 2 2 4   ? ?  ? ?Flowsheet Row Video Visit from 08/08/2021 in [facility] Video Visit from 05/21/2021 in [facility] Video Visit from 02/12/2021 in [facility]   ?C-SSRS RISK CATEGORY Error: Question 6 not populated Error: Q3, 4, or 5 should not be populated when Q2 is No Error: Q7 should not be populated when Q6 is No  ? ?  ? ? ? ?Assessment and Plan: Kursten Bogdon is a 36 year old

## 2021-11-08 ENCOUNTER — Other Ambulatory Visit: Payer: Self-pay

## 2021-12-09 ENCOUNTER — Encounter (HOSPITAL_COMMUNITY): Payer: Self-pay | Admitting: Psychiatry

## 2021-12-09 ENCOUNTER — Other Ambulatory Visit: Payer: Self-pay

## 2021-12-09 ENCOUNTER — Telehealth (INDEPENDENT_AMBULATORY_CARE_PROVIDER_SITE_OTHER): Payer: No Payment, Other | Admitting: Psychiatry

## 2021-12-09 DIAGNOSIS — F4310 Post-traumatic stress disorder, unspecified: Secondary | ICD-10-CM

## 2021-12-09 DIAGNOSIS — F411 Generalized anxiety disorder: Secondary | ICD-10-CM

## 2021-12-09 DIAGNOSIS — F3130 Bipolar disorder, current episode depressed, mild or moderate severity, unspecified: Secondary | ICD-10-CM

## 2021-12-09 MED ORDER — TRAZODONE HCL 100 MG PO TABS
ORAL_TABLET | Freq: Every evening | ORAL | 3 refills | Status: DC | PRN
  Filled 2021-12-09: qty 30, 30d supply, fill #0

## 2021-12-09 MED ORDER — ARIPIPRAZOLE 15 MG PO TABS
15.0000 mg | ORAL_TABLET | Freq: Every day | ORAL | 3 refills | Status: DC
  Filled 2022-01-09: qty 30, 30d supply, fill #0
  Filled 2022-02-11: qty 30, 30d supply, fill #1

## 2021-12-09 MED ORDER — BUPROPION HCL ER (XL) 150 MG PO TB24
ORAL_TABLET | Freq: Every morning | ORAL | 3 refills | Status: DC
  Filled 2022-01-09: qty 30, 30d supply, fill #0
  Filled 2022-02-11: qty 30, 30d supply, fill #1

## 2021-12-09 MED ORDER — LAMOTRIGINE 100 MG PO TABS
100.0000 mg | ORAL_TABLET | Freq: Every day | ORAL | 3 refills | Status: DC
  Filled 2022-01-09: qty 30, 30d supply, fill #0
  Filled 2022-02-11: qty 30, 30d supply, fill #1

## 2021-12-09 MED ORDER — PRAZOSIN HCL 1 MG PO CAPS
ORAL_CAPSULE | ORAL | 3 refills | Status: DC
  Filled 2021-12-09: qty 30, 30d supply, fill #0

## 2021-12-09 MED ORDER — HYDROXYZINE HCL 50 MG PO TABS
ORAL_TABLET | ORAL | 3 refills | Status: DC
  Filled 2021-12-09: qty 90, 30d supply, fill #0

## 2021-12-09 NOTE — Progress Notes (Signed)
BH MD/PA/NP OP Progress Note Virtual Visit via Video Note  I connected with Sara Pearson on 12/09/21 at 11:30 AM EDT by a video enabled telemedicine application and verified that I am speaking with the correct person using two identifiers.  Location: Patient: Home Provider: Clinic   I discussed the limitations of evaluation and management by telemedicine and the availability of in person appointments. The patient expressed understanding and agreed to proceed.  I provided 30 minutes of non-face-to-face time during this encounter.      12/09/2021 11:26 AM Sara Pearson  MRN:  [id number]  Chief Complaint: "Things are really good"  HPI: 36 year old female seen today for followup psychiatric evaluation. She has a psychiatric history of Bipolar 1, anxiety, depression, and PTSD. She is currently managed on Trazodone 100 mg nightly, Abilify 15 mg daily, Lamictal 100 mg daily, prazosin 1 mg nightly, hydroxyzine 50 mg three times daily, and Wellbutrin XL 150 mg daily. She notes her medications are effective in managing her psychiatric conditions.     Today she was well groomed, pleasant, cooperative, engaged in conversation, and maintained eye contact.  She informed provider that things are going really good.  She notes that her mood is stable and notes that she has minimal anxiety and depression.  Provider conducted a GAD-7 and patient scored a 0.  Provider also conducted PHQ-9 and patient scored a 2.  She endorses adequate sleep and appetite.  Today she denies SI/HI/VAH, mania, paranoia.    No medication changes made today.  Patient agreeable to continue medications as prescribed.  She will follow-up with outpatient counseling for therapy.  No other concerns noted at this time.      Visit Diagnosis:  No diagnosis found.   Past Psychiatric History:  Bipolar 1, anxiety, depression, and PTSD.  Past Medical History:  Past Medical History:  Diagnosis Date   Bipolar 1 disorder (HCC)     Heel spur    Hypertension    Osteogenesis imperfecta    PCOS (polycystic ovarian syndrome)    No past surgical history on file.  Family Psychiatric History: Unknown    Family History: No family history on file.  Social History:  Social History   Socioeconomic History   Marital status: Married    Spouse name: Not on file   Number of children: Not on file   Years of education: Not on file   Highest education level: Not on file  Occupational History   Not on file  Tobacco Use   Smoking status: Never   Smokeless tobacco: Never  Vaping Use   Vaping Use: Every day  Substance and Sexual Activity   Alcohol use: Yes   Drug use: Never   Sexual activity: Yes  Other Topics Concern   Not on file  Social History Narrative   Not on file   Social Determinants of Health   Financial Resource Strain: High Risk (03/06/2021)   Overall Financial Resource Strain (CARDIA)    Difficulty of Paying Living Expenses: Hard  Food Insecurity: Food Insecurity Present (03/06/2021)   Hunger Vital Sign    Worried About Running Out of Food in the Last Year: Often true    Ran Out of Food in the Last Year: Sometimes true  Transportation Needs: No Transportation Needs (12/03/2020)   PRAPARE - Administrator, Civil Service (Medical): No    Lack of Transportation (Non-Medical): No  Physical Activity: Sufficiently Active (04/16/2020)   Exercise Vital Sign  Days of Exercise per Week: 3 days    Minutes of Exercise per Session: 60 min  Stress: Stress Concern Present (03/06/2021)   [employer] of Occupational Health - Occupational Stress Questionnaire    Feeling of Stress : To some extent  Social Connections: Moderately Isolated (03/06/2021)   Social Connection and Isolation Panel [NHANES]    Frequency of Communication with Friends and Family: More than three times a week    Frequency of Social Gatherings with Friends and Family: Once a week    Attends Religious Services: Never    Automotive engineer or Organizations: No    Attends Banker Meetings: Never    Marital Status: Married    Allergies:  Allergies  Allergen Reactions   Codeine    Iodine    Rocephin [Ceftriaxone Sodium In Dextrose]    Zofran [Ondansetron Hcl]     Metabolic Disorder Labs: Lab Results  Component Value Date   HGBA1C 4.8 10/22/2020   No results found for: "PROLACTIN" No results found for: "CHOL", "TRIG", "HDL", "CHOLHDL", "VLDL", "LDLCALC" Lab Results  Component Value Date   TSH 0.580 10/22/2020    Therapeutic Level Labs: No results found for: "LITHIUM" Lab Results  Component Value Date   VALPROATE 27 (L) 05/04/2020   No results found for: "CBMZ"  Current Medications: Current Outpatient Medications  Medication Sig Dispense Refill   ARIPiprazole (ABILIFY) 15 MG tablet Take 1 tablet (15 mg total) by mouth daily. 30 tablet 3   buPROPion (WELLBUTRIN XL) 150 MG 24 hr tablet TAKE 1 TABLET (150 MG TOTAL) BY MOUTH EVERY MORNING. 30 tablet 3   hydrOXYzine (ATARAX) 50 MG tablet TAKE 1 TABLET (50 MG TOTAL) BY MOUTH 3 (THREE) TIMES DAILY AS NEEDED. 90 tablet 3   lamoTRIgine (LAMICTAL) 100 MG tablet Take 1 tablet (100 mg total) by mouth daily. 30 tablet 3   metoprolol succinate (TOPROL-XL) 50 MG 24 hr tablet Take 1 tablet (50 mg total) by mouth daily. Take with or immediately following a meal. 30 tablet 6   Norgestimate-Ethinyl Estradiol Triphasic (ORTHO TRI-CYCLEN LO) 0.18/0.215/0.25 MG-25 MCG tab Take 1 tablet by mouth daily. 28 tablet 11   prazosin (MINIPRESS) 1 MG capsule TAKE 1 CAPSULE (1 MG TOTAL) BY MOUTH AT BEDTIME. 30 capsule 3   traZODone (DESYREL) 100 MG tablet TAKE 1 TABLET (100 MG TOTAL) BY MOUTH AT BEDTIME AS NEEDED FOR SLEEP. 30 tablet 3   No current facility-administered medications for this visit.     Musculoskeletal: Strength & Muscle Tone: within normal limits and  telehealth visit Gait & Station: normal,  telehealth visit Patient leans:  N/A  Psychiatric Specialty Exam: Review of Systems  There were no vitals taken for this visit.There is no height or weight on file to calculate BMI.  General Appearance: Well Groomed  Eye Contact:  Good  Speech:  Clear and Coherent and Normal Rate  Volume:  Normal  Mood:  Euthymic  Affect:  Appropriate and Congruent  Thought Process:  Coherent, Goal Directed and Linear  Orientation:  Full (Time, Place, and Person)  Thought Content: WDL and Logical   Suicidal Thoughts:  No  Homicidal Thoughts:  No  Memory:  Immediate;   Good Recent;   Good Remote;   Good  Judgement:  Good  Insight:  Good  Psychomotor Activity:  Normal  Concentration:  Concentration: Good and Attention Span: Good  Recall:  Good  Fund of Knowledge: Good  Language: Good  Akathisia:  no  Handed:  Right  AIMS (if indicated): Not done  Assets:  Communication Skills Desire for Improvement Financial Resources/Insurance Housing Intimacy Social Support  ADL's:  Intact  Cognition: WNL  Sleep:  Good   Screenings: AUDIT    Advertising copywriter from 11/28/2020 in [facility] Counselor from 04/16/2020 in [facility]  Alcohol Use Disorder Identification Test Final Score (AUDIT) 6 8      GAD-7    Flowsheet Row Video Visit from 08/08/2021 in [facility] Counselor from 06/26/2021 in [facility] Video Visit from 05/21/2021 in [facility] Counselor from 04/03/2021 in [facility] Counselor from 03/06/2021 in [facility]  Total GAD-7 Score 1 0 3 4 9       PHQ2-9    Flowsheet Row Video Visit from 08/08/2021 in [facility] Counselor from 06/26/2021 in [facility] Video Visit from 05/21/2021 in [facility] Counselor from 04/03/2021 in [facility] Counselor from 03/06/2021 in [facility]  PHQ-2 Total Score 0 0 0 0 1  PHQ-9 Total Score 0 0 2 2 4       Flowsheet Row Video Visit from 08/08/2021 in [facility] Video Visit from 05/21/2021 in [facility] Video Visit from 02/12/2021 in [facility]  C-SSRS RISK CATEGORY Error: Question 6 not populated Error: Q3, 4, or 5 should not be populated when Q2 is No Error: Q7 should not be populated when Q6 is No        Assessment and Plan: Patient notes that she is doing well on her current medication regimen.  No medication changes made today.  Patient agreeable to continue medications as prescribed  1. Bipolar I disorder, most recent episode depressed (HCC)  Continue- ARIPiprazole (ABILIFY) 15 MG tablet; Take 1 tablet (15 mg total) by mouth daily.  Dispense: 30 tablet; Refill: 3 Continue- buPROPion (WELLBUTRIN XL) 150 MG 24 hr tablet; TAKE 1 TABLET (150 MG TOTAL) BY MOUTH EVERY MORNING.  Dispense: 30 tablet; Refill: 3 Continue- lamoTRIgine (LAMICTAL) 100 MG tablet; Take 1 tablet (100 mg total) by mouth daily.  Dispense: 30 tablet; Refill: 3 Continue- traZODone (DESYREL) 100 MG tablet; TAKE 1 TABLET (100 MG TOTAL) BY MOUTH AT BEDTIME AS NEEDED FOR SLEEP.  Dispense: 30 tablet; Refill: 3  2. GAD (generalized anxiety disorder)  Continue- hydrOXYzine (ATARAX) 50 MG tablet; TAKE 1 TABLET (50 MG TOTAL) BY MOUTH 3 (THREE) TIMES DAILY AS NEEDED.  Dispense: 90 tablet; Refill: 3 Continue- traZODone (DESYREL) 100 MG tablet; TAKE 1 TABLET (100 MG TOTAL) BY MOUTH AT BEDTIME AS NEEDED FOR SLEEP.  Dispense: 30 tablet; Refill: 3  3. PTSD (post-traumatic stress disorder)  Continue- prazosin (MINIPRESS) 1 MG capsule; TAKE 1 CAPSULE (1 MG TOTAL) BY MOUTH AT BEDTIME.  Dispense: 30 capsule; Refill: 3       Follow-up in 3 months Follow-up with therapy  Shanna Cisco, NP 12/09/2021, 11:26 AM

## 2021-12-10 ENCOUNTER — Other Ambulatory Visit: Payer: Self-pay

## 2021-12-16 ENCOUNTER — Emergency Department (HOSPITAL_COMMUNITY): Payer: No Typology Code available for payment source

## 2021-12-16 ENCOUNTER — Emergency Department (HOSPITAL_COMMUNITY)
Admission: EM | Admit: 2021-12-16 | Discharge: 2021-12-16 | Disposition: A | Discharge status: Home / Self Care | Payer: No Typology Code available for payment source | Attending: Emergency Medicine | Admitting: Emergency Medicine

## 2021-12-16 ENCOUNTER — Encounter (HOSPITAL_COMMUNITY): Payer: Self-pay | Admitting: Emergency Medicine

## 2021-12-16 DIAGNOSIS — Y92410 Unspecified street and highway as the place of occurrence of the external cause: Secondary | ICD-10-CM | POA: Diagnosis not present

## 2021-12-16 DIAGNOSIS — Z79899 Other long term (current) drug therapy: Secondary | ICD-10-CM | POA: Insufficient documentation

## 2021-12-16 DIAGNOSIS — R0789 Other chest pain: Secondary | ICD-10-CM | POA: Insufficient documentation

## 2021-12-16 LAB — CBC WITH DIFFERENTIAL/PLATELET
Abs Immature Granulocytes: 0.16 K/uL — ABNORMAL HIGH (ref 0.00–0.07)
Basophils Absolute: 0.1 K/uL (ref 0.0–0.1)
Basophils Relative: 0 %
Eosinophils Absolute: 0.2 K/uL (ref 0.0–0.5)
Eosinophils Relative: 1 %
HCT: 49.9 % — ABNORMAL HIGH (ref 36.0–46.0)
Hemoglobin: 17.1 g/dL — ABNORMAL HIGH (ref 12.0–15.0)
Immature Granulocytes: 1 %
Lymphocytes Relative: 22 %
Lymphs Abs: 4.1 K/uL — ABNORMAL HIGH (ref 0.7–4.0)
MCH: 27.4 pg (ref 26.0–34.0)
MCHC: 34.3 g/dL (ref 30.0–36.0)
MCV: 80.1 fL (ref 80.0–100.0)
Monocytes Absolute: 1 K/uL (ref 0.1–1.0)
Monocytes Relative: 5 %
Neutro Abs: 13.5 K/uL — ABNORMAL HIGH (ref 1.7–7.7)
Neutrophils Relative %: 71 %
Platelets: 254 K/uL (ref 150–400)
RBC: 6.23 MIL/uL — ABNORMAL HIGH (ref 3.87–5.11)
RDW: 13.2 % (ref 11.5–15.5)
WBC: 19.1 K/uL — ABNORMAL HIGH (ref 4.0–10.5)
nRBC: 0 % (ref 0.0–0.2)

## 2021-12-16 LAB — BASIC METABOLIC PANEL
Anion gap: 10 (ref 5–15)
BUN: 10 mg/dL (ref 6–20)
CO2: 19 mmol/L — ABNORMAL LOW (ref 22–32)
Calcium: 9.2 mg/dL (ref 8.9–10.3)
Chloride: 108 mmol/L (ref 98–111)
Creatinine, Ser: 0.65 mg/dL (ref 0.44–1.00)
GFR, Estimated: >60 mL/min (ref >60)
Glucose, Bld: 84 mg/dL (ref 70–99)
Potassium: 3.6 mmol/L (ref 3.5–5.1)
Sodium: 137 mmol/L (ref 135–145)

## 2021-12-16 LAB — I-STAT BETA HCG BLOOD, ED (MC, WL, AP ONLY): I-stat hCG, quantitative: <5 m[IU]/mL (ref <5)

## 2021-12-16 MED ORDER — ACETAMINOPHEN 500 MG PO TABS
1000.0000 mg | ORAL_TABLET | Freq: Once | ORAL | Status: AC
  Administered 2021-12-16: 1000 mg via ORAL
  Filled 2021-12-16: qty 2
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
## 2022-01-09 ENCOUNTER — Other Ambulatory Visit: Payer: Self-pay

## 2022-01-10 ENCOUNTER — Other Ambulatory Visit: Payer: Self-pay

## 2022-02-12 ENCOUNTER — Other Ambulatory Visit: Payer: Self-pay

## 2022-02-14 ENCOUNTER — Other Ambulatory Visit: Payer: Self-pay
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
## 2022-03-13 ENCOUNTER — Telehealth (INDEPENDENT_AMBULATORY_CARE_PROVIDER_SITE_OTHER): Payer: No Payment, Other | Admitting: Psychiatry

## 2022-03-13 ENCOUNTER — Other Ambulatory Visit: Payer: Self-pay

## 2022-03-13 ENCOUNTER — Encounter (HOSPITAL_COMMUNITY): Payer: Self-pay | Admitting: Psychiatry

## 2022-03-13 DIAGNOSIS — F3130 Bipolar disorder, current episode depressed, mild or moderate severity, unspecified: Secondary | ICD-10-CM

## 2022-03-13 DIAGNOSIS — F4310 Post-traumatic stress disorder, unspecified: Secondary | ICD-10-CM

## 2022-03-13 DIAGNOSIS — F411 Generalized anxiety disorder: Secondary | ICD-10-CM | POA: Diagnosis not present

## 2022-03-13 MED ORDER — ARIPIPRAZOLE 15 MG PO TABS
15.0000 mg | ORAL_TABLET | Freq: Every day | ORAL | 3 refills | Status: DC
  Filled 2022-03-13 (×2): qty 15, 15d supply, fill #0
  Filled 2022-04-15: qty 30, 30d supply, fill #1
  Filled 2022-05-16: qty 30, 30d supply, fill #2
  Filled 2022-06-18: qty 30, 30d supply, fill #3

## 2022-03-13 MED ORDER — LAMOTRIGINE 100 MG PO TABS
100.0000 mg | ORAL_TABLET | Freq: Every day | ORAL | 3 refills | Status: DC
  Filled 2022-03-13: qty 30, 30d supply, fill #0
  Filled 2022-04-15: qty 30, 30d supply, fill #1
  Filled 2022-05-16: qty 30, 30d supply, fill #2
  Filled 2022-06-18: qty 30, 30d supply, fill #3

## 2022-03-13 MED ORDER — BUPROPION HCL ER (XL) 150 MG PO TB24
150.0000 mg | ORAL_TABLET | Freq: Every morning | ORAL | 3 refills | Status: DC
  Filled 2022-03-13: qty 30, 30d supply, fill #0
  Filled 2022-04-15: qty 30, 30d supply, fill #1
  Filled 2022-05-16: qty 30, 30d supply, fill #2
  Filled 2022-06-18: qty 30, 30d supply, fill #3

## 2022-03-13 MED ORDER — HYDROXYZINE HCL 50 MG PO TABS
50.0000 mg | ORAL_TABLET | Freq: Three times a day (TID) | ORAL | 3 refills | Status: DC | PRN
  Filled 2022-03-13: qty 90, 30d supply, fill #0

## 2022-03-13 MED ORDER — PRAZOSIN HCL 1 MG PO CAPS
1.0000 mg | ORAL_CAPSULE | Freq: Every day | ORAL | 3 refills | Status: DC
  Filled 2022-03-13: qty 30, 30d supply, fill #0

## 2022-03-13 MED ORDER — TRAZODONE HCL 100 MG PO TABS
100.0000 mg | ORAL_TABLET | Freq: Every evening | ORAL | 3 refills | Status: DC | PRN
  Filled 2022-03-13: qty 30, 30d supply, fill #0

## 2022-03-13 NOTE — Progress Notes (Signed)
BH MD/PA/NP OP Progress Note Virtual Visit via Video Note  I connected with Sara Pearson on 03/13/22 at  8:30 AM EDT by a video enabled telemedicine application and verified that I am speaking with the correct person using two identifiers.  Location: Patient: Home Provider: Clinic   I discussed the limitations of evaluation and management by telemedicine and the availability of in person appointments. The patient expressed understanding and agreed to proceed.  I provided 30 minutes of non-face-to-face time during this encounter.      03/13/2022 8:25 AM Sara Pearson  MRN:  [id number]  Chief Complaint: "Every thing is the same"  HPI: 36 year old female seen today for followup psychiatric evaluation. She has a psychiatric history of Bipolar 1, anxiety, depression, and PTSD. She is currently managed on Trazodone 100 mg nightly, Abilify 15 mg daily, Lamictal 100 mg daily, prazosin 1 mg nightly, hydroxyzine 50 mg three times daily, and Wellbutrin XL 150 mg daily. She notes her medications are effective in managing her psychiatric conditions.     Today she was well groomed, pleasant, cooperative, engaged in conversation, and maintained eye contact.  She informed provider that everything is the same.  She notes that her mood is stable and notes that she has minimal anxiety and depression.  Provider conducted a GAD-7 and patient scored a 0, at her last visit she scored a 0.  Provider also conducted PHQ-9 and patient scored a 0, at her last visit she scored a 2.  She endorses adequate sleep and appetite.  Today she denies SI/HI/VAH, mania, paranoia.    No medication changes made today.  Patient agreeable to continue medications as prescribed.  She will follow-up with outpatient counseling for therapy.  No other concerns noted at this time.      Visit Diagnosis:    ICD-10-CM   1. Bipolar I disorder, most recent episode depressed (HCC)  F31.30 ARIPiprazole (ABILIFY) 15 MG tablet     buPROPion (WELLBUTRIN XL) 150 MG 24 hr tablet    lamoTRIgine (LAMICTAL) 100 MG tablet    traZODone (DESYREL) 100 MG tablet    2. GAD (generalized anxiety disorder)  F41.1 hydrOXYzine (ATARAX) 50 MG tablet    traZODone (DESYREL) 100 MG tablet    3. PTSD (post-traumatic stress disorder)  F43.10 prazosin (MINIPRESS) 1 MG capsule      Past Psychiatric History:  Bipolar 1, anxiety, depression, and PTSD.  Past Medical History:  Past Medical History:  Diagnosis Date   Bipolar 1 disorder (HCC)    Heel spur    Hypertension    Osteogenesis imperfecta    PCOS (polycystic ovarian syndrome)    No past surgical history on file.  Family Psychiatric History: Unknown    Family History: No family history on file.  Social History:  Social History   Socioeconomic History   Marital status: Married    Spouse name: Not on file   Number of children: Not on file   Years of education: Not on file   Highest education level: Not on file  Occupational History   Not on file  Tobacco Use   Smoking status: Never   Smokeless tobacco: Never  Vaping Use   Vaping Use: Every day  Substance and Sexual Activity   Alcohol use: Yes   Drug use: Never   Sexual activity: Yes  Other Topics Concern   Not on file  Social History Narrative   Not on file   Social Determinants of Health  Financial Resource Strain: High Risk (03/06/2021)   Overall Financial Resource Strain (CARDIA)    Difficulty of Paying Living Expenses: Hard  Food Insecurity: Food Insecurity Present (03/06/2021)   Hunger Vital Sign    Worried About Running Out of Food in the Last Year: Often true    Ran Out of Food in the Last Year: Sometimes true  Transportation Needs: No Transportation Needs (12/03/2020)   PRAPARE - Administrator, Civil Service (Medical): No    Lack of Transportation (Non-Medical): No  Physical Activity: Sufficiently Active (04/16/2020)   Exercise Vital Sign    Days of Exercise per Week: 3 days     Minutes of Exercise per Session: 60 min  Stress: Stress Concern Present (03/06/2021)   [employer] of Occupational Health - Occupational Stress Questionnaire    Feeling of Stress : To some extent  Social Connections: Moderately Isolated (03/06/2021)   Social Connection and Isolation Panel [NHANES]    Frequency of Communication with Friends and Family: More than three times a week    Frequency of Social Gatherings with Friends and Family: Once a week    Attends Religious Services: Never    Database administrator or Organizations: No    Attends Banker Meetings: Never    Marital Status: Married    Allergies:  Allergies  Allergen Reactions   Codeine    Iodine    Rocephin [Ceftriaxone Sodium In Dextrose]    Zofran [Ondansetron Hcl]     Metabolic Disorder Labs: Lab Results  Component Value Date   HGBA1C 4.8 10/22/2020   No results found for: "PROLACTIN" No results found for: "CHOL", "TRIG", "HDL", "CHOLHDL", "VLDL", "LDLCALC" Lab Results  Component Value Date   TSH 0.580 10/22/2020    Therapeutic Level Labs: No results found for: "LITHIUM" Lab Results  Component Value Date   VALPROATE 27 (L) 05/04/2020   No results found for: "CBMZ"  Current Medications: Current Outpatient Medications  Medication Sig Dispense Refill   ARIPiprazole (ABILIFY) 15 MG tablet Take 1 tablet (15 mg total) by mouth daily. 30 tablet 3   buPROPion (WELLBUTRIN XL) 150 MG 24 hr tablet TAKE 1 TABLET (150 MG TOTAL) BY MOUTH EVERY MORNING. 30 tablet 3   hydrOXYzine (ATARAX) 50 MG tablet TAKE 1 TABLET (50 MG TOTAL) BY MOUTH 3 (THREE) TIMES DAILY AS NEEDED. 90 tablet 3   lamoTRIgine (LAMICTAL) 100 MG tablet Take 1 tablet (100 mg total) by mouth daily. 30 tablet 3   metoprolol succinate (TOPROL-XL) 50 MG 24 hr tablet Take 1 tablet (50 mg total) by mouth daily. Take with or immediately following a meal. 30 tablet 6   Norgestimate-Ethinyl Estradiol Triphasic (ORTHO TRI-CYCLEN LO)  0.18/0.215/0.25 MG-25 MCG tab Take 1 tablet by mouth daily. 28 tablet 11   prazosin (MINIPRESS) 1 MG capsule TAKE 1 CAPSULE (1 MG TOTAL) BY MOUTH AT BEDTIME. 30 capsule 3   traZODone (DESYREL) 100 MG tablet TAKE 1 TABLET (100 MG TOTAL) BY MOUTH AT BEDTIME AS NEEDED FOR SLEEP. 30 tablet 3   No current facility-administered medications for this visit.     Musculoskeletal: Strength & Muscle Tone: within normal limits and  telehealth visit Gait & Station: normal,  telehealth visit Patient leans: N/A  Psychiatric Specialty Exam: Review of Systems  There were no vitals taken for this visit.There is no height or weight on file to calculate BMI.  General Appearance: Well Groomed  Eye Contact:  Good  Speech:  Clear and Coherent and  Normal Rate  Volume:  Normal  Mood:  Euthymic  Affect:  Appropriate and Congruent  Thought Process:  Coherent, Goal Directed and Linear  Orientation:  Full (Time, Place, and Person)  Thought Content: WDL and Logical   Suicidal Thoughts:  No  Homicidal Thoughts:  No  Memory:  Immediate;   Good Recent;   Good Remote;   Good  Judgement:  Good  Insight:  Good  Psychomotor Activity:  Normal  Concentration:  Concentration: Good and Attention Span: Good  Recall:  Good  Fund of Knowledge: Good  Language: Good  Akathisia:  no  Handed:  Right  AIMS (if indicated): Not done  Assets:  Communication Skills Desire for Improvement Financial Resources/Insurance Housing Intimacy Social Support  ADL's:  Intact  Cognition: WNL  Sleep:  Good   Screenings: AUDIT    Health and safety inspector from 11/28/2020 in [facility] Counselor from 04/16/2020 in [facility]  Alcohol Use Disorder Identification Test Final Score (AUDIT) 6 8      GAD-7    Flowsheet Row Video Visit from 03/13/2022 in [facility] Video Visit from 12/09/2021 in [facility] Video Visit  from 08/08/2021 in [facility] Counselor from 06/26/2021 in [facility] Video Visit from 05/21/2021 in [facility]  Total GAD-7 Score 0 0 1 0 3      PHQ2-9    Flowsheet Row Video Visit from 03/13/2022 in [facility] Video Visit from 12/09/2021 in [facility] Video Visit from 08/08/2021 in [facility] Counselor from 06/26/2021 in [facility] Video Visit from 05/21/2021 in [facility]  PHQ-2 Total Score 0 0 0 0 0  PHQ-9 Total Score 0 2 0 0 2      Flowsheet Row ED from 12/16/2021 in [place] Video Visit from 08/08/2021 in [facility] Video Visit from 05/21/2021 in [place] No Risk Error: Question 6 not populated Error: Q3, 4, or 5 should not be populated when Q2 is No        Assessment and Plan: Patient notes that she is doing well on her current medication regimen.  No medication changes made today.  Patient agreeable to continue medications as prescribed  1. Bipolar I disorder, most recent episode depressed (HCC)  Continue- ARIPiprazole (ABILIFY) 15 MG tablet; Take 1 tablet (15 mg total) by mouth daily.  Dispense: 30 tablet; Refill: 3 Continue- buPROPion (WELLBUTRIN XL) 150 MG 24 hr tablet; TAKE 1 TABLET (150 MG TOTAL) BY MOUTH EVERY MORNING.  Dispense: 30 tablet; Refill: 3 Continue- lamoTRIgine (LAMICTAL) 100 MG tablet; Take 1 tablet (100 mg total) by mouth daily.  Dispense: 30 tablet; Refill: 3 Continue- traZODone (DESYREL) 100 MG tablet; TAKE 1 TABLET (100 MG TOTAL) BY MOUTH AT BEDTIME AS NEEDED FOR SLEEP.  Dispense: 30 tablet; Refill: 3  2. GAD (generalized anxiety disorder)  Continue- hydrOXYzine (ATARAX) 50 MG tablet; TAKE 1 TABLET (50 MG  TOTAL) BY MOUTH 3 (THREE) TIMES DAILY AS NEEDED.  Dispense: 90 tablet; Refill: 3 Continue- traZODone (DESYREL) 100 MG tablet; TAKE 1 TABLET (100 MG TOTAL) BY MOUTH AT BEDTIME AS NEEDED FOR SLEEP.  Dispense: 30 tablet; Refill: 3  3. PTSD (post-traumatic stress disorder)  Continue- prazosin (MINIPRESS) 1 MG capsule; TAKE 1  CAPSULE (1 MG TOTAL) BY MOUTH AT BEDTIME.  Dispense: 30 capsule; Refill: 3       Follow-up in 3 months Follow-up with therapy  Shanna Cisco, NP 03/13/2022, 8:25 AM

## 2022-03-14 ENCOUNTER — Other Ambulatory Visit: Payer: Self-pay

## 2022-03-18 ENCOUNTER — Other Ambulatory Visit: Payer: Self-pay

## 2022-04-15 ENCOUNTER — Other Ambulatory Visit: Payer: Self-pay

## 2022-04-16 ENCOUNTER — Other Ambulatory Visit: Payer: Self-pay

## 2022-05-16 ENCOUNTER — Other Ambulatory Visit: Payer: Self-pay

## 2022-06-18 ENCOUNTER — Other Ambulatory Visit: Payer: Self-pay

## 2022-06-20 ENCOUNTER — Other Ambulatory Visit: Payer: Self-pay

## 2022-06-23 ENCOUNTER — Telehealth (INDEPENDENT_AMBULATORY_CARE_PROVIDER_SITE_OTHER): Payer: No Payment, Other | Admitting: Psychiatry

## 2022-06-23 ENCOUNTER — Encounter (HOSPITAL_COMMUNITY): Payer: Self-pay | Admitting: Psychiatry

## 2022-06-23 ENCOUNTER — Other Ambulatory Visit: Payer: Self-pay

## 2022-06-23 DIAGNOSIS — M545 Low back pain, unspecified: Secondary | ICD-10-CM | POA: Diagnosis not present

## 2022-06-23 DIAGNOSIS — F411 Generalized anxiety disorder: Secondary | ICD-10-CM

## 2022-06-23 DIAGNOSIS — Z0000 Encounter for general adult medical examination without abnormal findings: Secondary | ICD-10-CM

## 2022-06-23 DIAGNOSIS — F3130 Bipolar disorder, current episode depressed, mild or moderate severity, unspecified: Secondary | ICD-10-CM | POA: Diagnosis not present

## 2022-06-23 MED ORDER — TRAZODONE HCL 100 MG PO TABS
100.0000 mg | ORAL_TABLET | Freq: Every evening | ORAL | 3 refills | Status: DC | PRN
  Filled 2022-06-23 – 2022-07-16 (×2): qty 30, 30d supply, fill #0

## 2022-06-23 MED ORDER — ARIPIPRAZOLE 15 MG PO TABS
15.0000 mg | ORAL_TABLET | Freq: Every day | ORAL | 3 refills | Status: DC
  Filled 2022-06-23 – 2022-07-16 (×2): qty 30, 30d supply, fill #0
  Filled 2022-08-17: qty 30, 30d supply, fill #1
  Filled 2022-09-15: qty 30, 30d supply, fill #2

## 2022-06-23 MED ORDER — LAMOTRIGINE 100 MG PO TABS
100.0000 mg | ORAL_TABLET | Freq: Every day | ORAL | 3 refills | Status: DC
  Filled 2022-06-23 – 2022-07-16 (×2): qty 30, 30d supply, fill #0
  Filled 2022-08-17: qty 30, 30d supply, fill #1
  Filled 2022-09-15: qty 30, 30d supply, fill #2

## 2022-06-23 MED ORDER — BUPROPION HCL ER (XL) 150 MG PO TB24
150.0000 mg | ORAL_TABLET | Freq: Every morning | ORAL | 3 refills | Status: DC
  Filled 2022-06-23 – 2022-07-16 (×2): qty 30, 30d supply, fill #0
  Filled 2022-08-17: qty 30, 30d supply, fill #1
  Filled 2022-09-15: qty 30, 30d supply, fill #2

## 2022-06-23 NOTE — Progress Notes (Signed)
[facility] MD/PA/NP OP Progress Note Virtual Visit via Video Note  I connected with Sara Pearson on 06/23/22 at 11:30 AM EST by a video enabled telemedicine application and verified that I am speaking with the correct person using two identifiers.  Location: Patient: Home Provider: Clinic   I discussed the limitations of evaluation and management by telemedicine and the availability of in person appointments. The patient expressed understanding and agreed to proceed.  I provided 30 minutes of non-face-to-face time during this encounter.      06/23/2022 11:32 AM Sara Pearson  MRN:  [id number]  Chief Complaint: "I feel great"  HPI: 37 year old female seen today for followup psychiatric evaluation. She has a psychiatric history of Bipolar 1, anxiety, depression, and PTSD. She is currently managed on Trazodone 100 mg nightly, Abilify 15 mg daily, Lamictal 100 mg daily, prazosin 1 mg nightly, hydroxyzine 50 mg three times daily, and Wellbutrin XL 150 mg daily. She informed Probation officer that she no longer takes hydroxyzine or prazosin and notes her medications are effective in managing her psychiatric conditions.     Today she was well groomed, pleasant, cooperative, engaged in conversation, and maintained eye contact.  She informed provider that she feels great.  She notes that she finds her medications effective.  Patient notes that recently she got a promotion at [employer] and has mild anxiety about this but reports that she is able to cope with it.  Today provider conducted a GAD-7 and patient scored a 2, at her last visit she scored a 0.  Provider also conducted a PHQ-9 and patient scored a 2, at her last visit she scored a 0.  She endorses adequate sleep and appetite. Today she denies SI/HI/VAH, mania, paranoia.    Patient informed writer that she has back and leg pain.  Currently she notes that she does not have a PCP.  Patient referred to community health and wellness for primary care.  Patient  informed Probation officer that she takes Aleve to help manage her pain and notes that it somewhat effective in managing it.  At this time patient does not wish to restart prazosin or hydroxyzine.  She will continue all other medications as prescribed.  She will follow-up with outpatient counseling for therapy.  No other concerns noted at this time.      Visit Diagnosis:    ICD-10-CM   1. Well adult health check  Z00.00 Ambulatory referral to Internal Medicine    2. Bipolar I disorder, most recent episode depressed (HCC)  F31.30 ARIPiprazole (ABILIFY) 15 MG tablet    traZODone (DESYREL) 100 MG tablet    lamoTRIgine (LAMICTAL) 100 MG tablet    buPROPion (WELLBUTRIN XL) 150 MG 24 hr tablet    3. GAD (generalized anxiety disorder)  F41.1 traZODone (DESYREL) 100 MG tablet    4. Low back pain, unspecified back pain laterality, unspecified chronicity, unspecified whether sciatica present  M54.50 Ambulatory referral to Internal Medicine      Past Psychiatric History:  Bipolar 1, anxiety, depression, and PTSD.  Past Medical History:  Past Medical History:  Diagnosis Date   Bipolar 1 disorder ([place])    Heel spur    Hypertension    Osteogenesis imperfecta    PCOS (polycystic ovarian syndrome)    History reviewed. No pertinent surgical history.  Family Psychiatric History: Unknown    Family History: History reviewed. No pertinent family history.  Social History:  Social History   Socioeconomic History   Marital status: Married  Spouse name: Not on file   Number of children: Not on file   Years of education: Not on file   Highest education level: Not on file  Occupational History   Not on file  Tobacco Use   Smoking status: Never   Smokeless tobacco: Never  Vaping Use   Vaping Use: Every day  Substance and Sexual Activity   Alcohol use: Yes   Drug use: Never   Sexual activity: Yes  Other Topics Concern   Not on file  Social History Narrative   Not on file   Social  Determinants of Health   Financial Resource Strain: High Risk (03/06/2021)   Overall Financial Resource Strain (CARDIA)    Difficulty of Paying Living Expenses: Hard  Food Insecurity: Food Insecurity Present (03/06/2021)   Hunger Vital Sign    Worried About Running Out of Food in the Last Year: Often true    Ran Out of Food in the Last Year: Sometimes true  Transportation Needs: No Transportation Needs (12/03/2020)   PRAPARE - Administrator, Civil Service (Medical): No    Lack of Transportation (Non-Medical): No  Physical Activity: Sufficiently Active (04/16/2020)   Exercise Vital Sign    Days of Exercise per Week: 3 days    Minutes of Exercise per Session: 60 min  Stress: Stress Concern Present (03/06/2021)   [employer] of Occupational Health - Occupational Stress Questionnaire    Feeling of Stress : To some extent  Social Connections: Moderately Isolated (03/06/2021)   Social Connection and Isolation Panel [NHANES]    Frequency of Communication with Friends and Family: More than three times a week    Frequency of Social Gatherings with Friends and Family: Once a week    Attends Religious Services: Never    Database administrator or Organizations: No    Attends Banker Meetings: Never    Marital Status: Married    Allergies:  Allergies  Allergen Reactions   Codeine    Iodine    Rocephin [Ceftriaxone Sodium In Dextrose]    Zofran [Ondansetron Hcl]     Metabolic Disorder Labs: Lab Results  Component Value Date   HGBA1C 4.8 10/22/2020   No results found for: "PROLACTIN" No results found for: "CHOL", "TRIG", "HDL", "CHOLHDL", "VLDL", "LDLCALC" Lab Results  Component Value Date   TSH 0.580 10/22/2020    Therapeutic Level Labs: No results found for: "LITHIUM" Lab Results  Component Value Date   VALPROATE 27 (L) 05/04/2020   No results found for: "CBMZ"  Current Medications: Current Outpatient Medications  Medication Sig Dispense Refill    ARIPiprazole (ABILIFY) 15 MG tablet Take 1 tablet (15 mg total) by mouth daily. 30 tablet 3   buPROPion (WELLBUTRIN XL) 150 MG 24 hr tablet Take 1 tablet (150 mg total) by mouth every morning. 30 tablet 3   lamoTRIgine (LAMICTAL) 100 MG tablet Take 1 tablet (100 mg total) by mouth daily. 30 tablet 3   metoprolol succinate (TOPROL-XL) 50 MG 24 hr tablet Take 1 tablet (50 mg total) by mouth daily. Take with or immediately following a meal. 30 tablet 6   Norgestimate-Ethinyl Estradiol Triphasic (ORTHO TRI-CYCLEN LO) 0.18/0.215/0.25 MG-25 MCG tab Take 1 tablet by mouth daily. 28 tablet 11   traZODone (DESYREL) 100 MG tablet Take 1 tablet (100 mg total) by mouth at bedtime as needed for sleep. 30 tablet 3   No current facility-administered medications for this visit.     Musculoskeletal: Strength & Muscle  Tone: within normal limits and  telehealth visit Gait & Station: normal,  telehealth visit Patient leans: N/A  Psychiatric Specialty Exam: Review of Systems  There were no vitals taken for this visit.There is no height or weight on file to calculate BMI.  General Appearance: Well Groomed  Eye Contact:  Good  Speech:  Clear and Coherent and Normal Rate  Volume:  Normal  Mood:  Euthymic  Affect:  Appropriate and Congruent  Thought Process:  Coherent, Goal Directed and Linear  Orientation:  Full (Time, Place, and Person)  Thought Content: WDL and Logical   Suicidal Thoughts:  No  Homicidal Thoughts:  No  Memory:  Immediate;   Good Recent;   Good Remote;   Good  Judgement:  Good  Insight:  Good  Psychomotor Activity:  Normal  Concentration:  Concentration: Good and Attention Span: Good  Recall:  Good  Fund of Knowledge: Good  Language: Good  Akathisia:  no  Handed:  Right  AIMS (if indicated): Not done  Assets:  Communication Skills Desire for Improvement Financial Resources/Insurance Housing Intimacy Social Support  ADL's:  Intact  Cognition: WNL  Sleep:  Good    Screenings: AUDIT    Advertising copywriter from 11/28/2020 in [facility] Counselor from 04/16/2020 in [facility]  Alcohol Use Disorder Identification Test Final Score (AUDIT) 6 8      GAD-7    Flowsheet Row Video Visit from 06/23/2022 in [facility] Video Visit from 03/13/2022 in [facility] Video Visit from 12/09/2021 in [facility] Video Visit from 08/08/2021 in [facility] Counselor from 06/26/2021 in [facility]  Total GAD-7 Score 2 0 0 1 0      PHQ2-9    Flowsheet Row Video Visit from 06/23/2022 in [facility] Video Visit from 03/13/2022 in [facility] Video Visit from 12/09/2021 in [facility] Video Visit from 08/08/2021 in [facility] Counselor from 06/26/2021 in [facility]  PHQ-2 Total Score 0 0 0 0 0  PHQ-9 Total Score 2 0 2 0 0      Flowsheet Row ED from 12/16/2021 in [facility] EMERGENCY DEPARTMENT Video Visit from 08/08/2021 in [facility] Video Visit from 05/21/2021 in [facility]  C-SSRS RISK CATEGORY No Risk Error: Question 6 not populated Error: Q3, 4, or 5 should not be populated when Q2 is No        Assessment and Plan: Patient notes that she is doing well on her current medication regimen.  At this time she does not wish to restart prazosin or hydroxyzine.  Patient notes that she has leg and back pain.  Patient referred to community health and wellness for primary care.    1. Bipolar I disorder, most recent episode depressed (HCC)  Continue- ARIPiprazole (ABILIFY) 15 MG tablet; Take 1 tablet (15 mg total) by mouth daily.  Dispense: 30 tablet; Refill:  3 Continue- traZODone (DESYREL) 100 MG tablet; Take 1 tablet (100 mg total) by mouth at bedtime as needed for sleep.  Dispense: 30 tablet; Refill: 3 Continue- lamoTRIgine (LAMICTAL) 100 MG tablet; Take 1 tablet (100 mg total) by mouth daily.  Dispense: 30 tablet; Refill: 3 Continue - buPROPion (WELLBUTRIN XL) 150 MG 24 hr tablet; Take 1 tablet (150 mg total)  by mouth every morning.  Dispense: 30 tablet; Refill: 3  2. GAD (generalized anxiety disorder)  Continue- traZODone (DESYREL) 100 MG tablet; Take 1 tablet (100 mg total) by mouth at bedtime as needed for sleep.    3. Well adult health check  - Ambulatory referral to Internal Medicine  4. Low back pain, unspecified back pain laterality, unspecified chronicity, unspecified whether sciatica present  - Ambulatory referral to Internal Medicine        Follow-up in 3 months Follow-up with therapy  Shanna Cisco, NP 06/23/2022, 11:32 AM

## 2022-06-24 ENCOUNTER — Telehealth (HOSPITAL_COMMUNITY): Payer: No Payment, Other | Admitting: Psychiatry

## 2022-06-30 ENCOUNTER — Other Ambulatory Visit: Payer: Self-pay

## 2022-07-17 ENCOUNTER — Other Ambulatory Visit: Payer: Self-pay

## 2022-07-18 ENCOUNTER — Other Ambulatory Visit: Payer: Self-pay

## 2022-08-18 ENCOUNTER — Other Ambulatory Visit: Payer: Self-pay

## 2022-09-05 ENCOUNTER — Ambulatory Visit (INDEPENDENT_AMBULATORY_CARE_PROVIDER_SITE_OTHER): Payer: Self-pay

## 2022-09-05 ENCOUNTER — Ambulatory Visit (INDEPENDENT_AMBULATORY_CARE_PROVIDER_SITE_OTHER): Payer: Self-pay | Admitting: Podiatry

## 2022-09-05 DIAGNOSIS — M722 Plantar fascial fibromatosis: Secondary | ICD-10-CM

## 2022-09-05 DIAGNOSIS — M7731 Calcaneal spur, right foot: Secondary | ICD-10-CM

## 2022-09-05 MED ORDER — TRIAMCINOLONE ACETONIDE 10 MG/ML IJ SUSP
10.0000 mg | Freq: Once | INTRAMUSCULAR | Status: AC
  Administered 2022-09-05: 10 mg

## 2022-09-05 NOTE — Patient Instructions (Addendum)
For instructions on how to put on your Night Splint, please visit [email or web address]  ---   While at your visit today you received a steroid injection in your foot or ankle to help with your pain. Along with having the steroid medication there is some "numbing" medication in the shot that you received. Due to this you may notice some numbness to the area for the next couple of hours.   I would recommend limiting activity for the next few days to help the steroid injection take affect.    The actually benefit from the steroid injection may take up to 2-7 days to see a difference. You may actually experience a small (as in 10%) INCREASE in pain in the first 24 hours---that is common. It would be best if you can ice the area today and take anti-inflammatory medications (such as Ibuprofen, Motrin, or Aleve) if you are able to take these medications. If you were prescribed another medication to help with the pain go ahead and start that medication today    Things to watch out for that you should contact us or a health care provider urgently would include: 1. Unusual (as in more than 10%) increase in pain 2. New fever > 101.5 3. New swelling or redness of the injected area.  4. Streaking of red lines around the area injected.  If you have any questions or concerns about this, please give our office a call at [phone].   ---  Plantar Fasciitis (Heel Spur Syndrome) with Rehab The plantar fascia is a fibrous, ligament-like, soft-tissue structure that spans the bottom of the foot. Plantar fasciitis is a condition that causes pain in the foot due to inflammation of the tissue. SYMPTOMS  Pain and tenderness on the underneath side of the foot. Pain that worsens with standing or walking. CAUSES  Plantar fasciitis is caused by irritation and injury to the plantar fascia on the underneath side of the foot. Common mechanisms of injury include: Direct trauma to bottom of the foot. Damage to a  small nerve that runs under the foot where the main fascia attaches to the heel bone. Stress placed on the plantar fascia due to bone spurs. RISK INCREASES WITH:  Activities that place stress on the plantar fascia (running, jumping, pivoting, or cutting). Poor strength and flexibility. Improperly fitted shoes. Tight calf muscles. Flat feet. Failure to warm-up properly before activity. Obesity. PREVENTION Warm up and stretch properly before activity. Allow for adequate recovery between workouts. Maintain physical fitness: Strength, flexibility, and endurance. Cardiovascular fitness. Maintain a health body weight. Avoid stress on the plantar fascia. Wear properly fitted shoes, including arch supports for individuals who have flat feet.  PROGNOSIS  If treated properly, then the symptoms of plantar fasciitis usually resolve without surgery. However, occasionally surgery is necessary.  RELATED COMPLICATIONS  Recurrent symptoms that may result in a chronic condition. Problems of the lower back that are caused by compensating for the injury, such as limping. Pain or weakness of the foot during push-off following surgery. Chronic inflammation, scarring, and partial or complete fascia tear, occurring more often from repeated injections.  TREATMENT  Treatment initially involves the use of ice and medication to help reduce pain and inflammation. The use of strengthening and stretching exercises may help reduce pain with activity, especially stretches of the Achilles tendon. These exercises may be performed at home or with a therapist. Your caregiver may recommend that you use heel cups of arch supports to help reduce stress on the plantar   fascia. Occasionally, corticosteroid injections are given to reduce inflammation. If symptoms persist for greater than 6 months despite non-surgical (conservative), then surgery may be recommended.   MEDICATION  If pain medication is necessary, then  nonsteroidal anti-inflammatory medications, such as aspirin and ibuprofen, or other minor pain relievers, such as acetaminophen, are often recommended. Do not take pain medication within 7 days before surgery. Prescription pain relievers may be given if deemed necessary by your caregiver. Use only as directed and only as much as you need. Corticosteroid injections may be given by your caregiver. These injections should be reserved for the most serious cases, because they may only be given a certain number of times.  HEAT AND COLD Cold treatment (icing) relieves pain and reduces inflammation. Cold treatment should be applied for 10 to 15 minutes every 2 to 3 hours for inflammation and pain and immediately after any activity that aggravates your symptoms. Use ice packs or massage the area with a piece of ice (ice massage). Heat treatment may be used [place] to performing the stretching and strengthening activities prescribed by your caregiver, physical therapist, or athletic trainer. Use a heat pack or soak the injury in warm water.  SEEK IMMEDIATE MEDICAL CARE IF: Treatment seems to offer no benefit, or the condition worsens. Any medications produce adverse side effects.  EXERCISES- RANGE OF MOTION (ROM) AND STRETCHING EXERCISES - Plantar Fasciitis (Heel Spur Syndrome) These exercises may help you when beginning to rehabilitate your injury. Your symptoms may resolve with or without further involvement from your physician, physical therapist or athletic trainer. While completing these exercises, remember:  Restoring tissue flexibility helps normal motion to return to the joints. This allows healthier, less painful movement and activity. An effective stretch should be held for at least 30 seconds. A stretch should never be painful. You should only feel a gentle lengthening or release in the stretched tissue.  RANGE OF MOTION - Toe Extension, Flexion Sit with your right / left leg crossed over your  opposite knee. Grasp your toes and gently pull them back toward the top of your foot. You should feel a stretch on the bottom of your toes and/or foot. Hold this stretch for 10 seconds. Now, gently pull your toes toward the bottom of your foot. You should feel a stretch on the top of your toes and or foot. Hold this stretch for 10 seconds. Repeat  times. Complete this stretch 3 times per day.   RANGE OF MOTION - Ankle Dorsiflexion, Active Assisted Remove shoes and sit on a chair that is preferably not on a carpeted surface. Place right / left foot under knee. Extend your opposite leg for support. Keeping your heel down, slide your right / left foot back toward the chair until you feel a stretch at your ankle or calf. If you do not feel a stretch, slide your bottom forward to the edge of the chair, while still keeping your heel down. Hold this stretch for 10 seconds. Repeat 3 times. Complete this stretch 2 times per day.   STRETCH  Gastroc, Standing Place hands on wall. Extend right / left leg, keeping the front knee somewhat bent. Slightly point your toes inward on your back foot. Keeping your right / left heel on the floor and your knee straight, shift your weight toward the wall, not allowing your back to arch. You should feel a gentle stretch in the right / left calf. Hold this position for 10 seconds. Repeat 3 times. Complete this stretch 2   times per day.  STRETCH  Soleus, Standing Place hands on wall. Extend right / left leg, keeping the other knee somewhat bent. Slightly point your toes inward on your back foot. Keep your right / left heel on the floor, bend your back knee, and slightly shift your weight over the back leg so that you feel a gentle stretch deep in your back calf. Hold this position for 10 seconds. Repeat 3 times. Complete this stretch 2 times per day.  STRETCH  Gastrocsoleus, Standing  Note: This exercise can place a lot of stress on your foot and ankle. Please  complete this exercise only if specifically instructed by your caregiver.  Place the ball of your right / left foot on a step, keeping your other foot firmly on the same step. Hold on to the wall or a rail for balance. Slowly lift your other foot, allowing your body weight to press your heel down over the edge of the step. You should feel a stretch in your right / left calf. Hold this position for 10 seconds. Repeat this exercise with a slight bend in your right / left knee. Repeat 3 times. Complete this stretch 2 times per day.   STRENGTHENING EXERCISES - Plantar Fasciitis (Heel Spur Syndrome)  These exercises may help you when beginning to rehabilitate your injury. They may resolve your symptoms with or without further involvement from your physician, physical therapist or athletic trainer. While completing these exercises, remember:  Muscles can gain both the endurance and the strength needed for everyday activities through controlled exercises. Complete these exercises as instructed by your physician, physical therapist or athletic trainer. Progress the resistance and repetitions only as guided.  STRENGTH - Towel Curls Sit in a chair positioned on a non-carpeted surface. Place your foot on a towel, keeping your heel on the floor. Pull the towel toward your heel by only curling your toes. Keep your heel on the floor. Repeat 3 times. Complete this exercise 2 times per day.  STRENGTH - Ankle Inversion Secure one end of a rubber exercise band/tubing to a fixed object (table, pole). Loop the other end around your foot just before your toes. Place your fists between your knees. This will focus your strengthening at your ankle. Slowly, pull your big toe up and in, making sure the band/tubing is positioned to resist the entire motion. Hold this position for 10 seconds. Have your muscles resist the band/tubing as it slowly pulls your foot back to the starting position. Repeat 3 times. Complete  this exercises 2 times per day.  Document Released: 06/02/2005 Document Revised: 08/25/2011 Document Reviewed: 09/14/2008 [employer] Patient Information 2014 [employer].  

## 2022-09-05 NOTE — Progress Notes (Unsigned)
Subjective:   Patient ID: Sara Pearson, female   DOB: 37 y.o.   MRN: [id number]   HPI Chief Complaint  Patient presents with   Plantar Fasciitis    Patient has been dx with PF by foot dr in [place], Patient hasn't had treatment in a few years, Pain located in the arch and heel area    Started about 8 years ago then got better but now on her feet at [place] on [employer]. No injiries. No numbness or tingling. It hurts motsly in the morning it feels "so tight". Lately it has started to be all day. It hurts if she sits and and stands up. She takes naproxen which helps a litle.    ROS      Objective:  Physical Exam  ***     Assessment:  ***     Plan:  ***

## 2022-09-08 ENCOUNTER — Telehealth (HOSPITAL_COMMUNITY): Payer: Self-pay | Admitting: Psychiatry

## 2022-09-15 ENCOUNTER — Encounter (HOSPITAL_COMMUNITY): Payer: Self-pay | Admitting: Psychiatry

## 2022-09-15 ENCOUNTER — Telehealth (INDEPENDENT_AMBULATORY_CARE_PROVIDER_SITE_OTHER): Payer: No Payment, Other | Admitting: Psychiatry

## 2022-09-15 ENCOUNTER — Other Ambulatory Visit: Payer: Self-pay

## 2022-09-15 DIAGNOSIS — F411 Generalized anxiety disorder: Secondary | ICD-10-CM | POA: Diagnosis not present

## 2022-09-15 DIAGNOSIS — F3130 Bipolar disorder, current episode depressed, mild or moderate severity, unspecified: Secondary | ICD-10-CM | POA: Diagnosis not present

## 2022-09-15 MED ORDER — ARIPIPRAZOLE 15 MG PO TABS
15.0000 mg | ORAL_TABLET | Freq: Every day | ORAL | 3 refills | Status: DC
  Filled 2022-10-17: qty 30, 30d supply, fill #0
  Filled 2022-11-17: qty 30, 30d supply, fill #1

## 2022-09-15 MED ORDER — BUPROPION HCL ER (XL) 150 MG PO TB24
150.0000 mg | ORAL_TABLET | Freq: Every morning | ORAL | 3 refills | Status: DC
  Filled 2022-10-17: qty 30, 30d supply, fill #0
  Filled 2022-11-17: qty 30, 30d supply, fill #1

## 2022-09-15 MED ORDER — LAMOTRIGINE 100 MG PO TABS
100.0000 mg | ORAL_TABLET | Freq: Every day | ORAL | 3 refills | Status: DC
  Filled 2022-10-17: qty 30, 30d supply, fill #0
  Filled 2022-11-17: qty 30, 30d supply, fill #1

## 2022-09-15 MED ORDER — TRAZODONE HCL 100 MG PO TABS
100.0000 mg | ORAL_TABLET | Freq: Every evening | ORAL | 3 refills | Status: DC | PRN
  Filled 2022-09-15: qty 30, 30d supply, fill #0

## 2022-09-15 NOTE — Progress Notes (Signed)
[facility] MD/PA/NP OP Progress Note Virtual Visit via Video Note  I connected with Sara Pearson on 09/15/22 at 12:30 PM EDT by a video enabled telemedicine application and verified that I am speaking with the correct person using two identifiers.  Location: Patient: Home Provider: Clinic   I discussed the limitations of evaluation and management by telemedicine and the availability of in person appointments. The patient expressed understanding and agreed to proceed.  I provided 30 minutes of non-face-to-face time during this encounter.      09/15/2022 12:20 PM Sara Pearson  MRN:  [id number]  Chief Complaint: "I have been more irritable"  HPI: 37 year old female seen today for followup psychiatric evaluation. She has a psychiatric history of Bipolar 1, anxiety, depression, and PTSD. She is currently managed on Trazodone 100 mg nightly, Abilify 15 mg daily, Lamictal 100 mg daily, and Wellbutrin XL 150 mg daily.  Today she notes her medications are effective in managing her psychiatric conditions.     Today she logged in virtually however her camera was turned off.  During exam she was pleasant, cooperative, and engaged in conversation.  She informed Probation officer that recently she has been more irritable.  She notes that she feels like she is stressed but she has not identified a stressor.  She notes that work is going well.  Since her last visit she notes that her depression has worsened.  Provider conducted a PHQ-9 and patient scored a 12, at her last visit she scored a 2.  Provider also conducted a GAD-7 and patient scored a 12, at her last visit she scored a 2.  She notes that she has been sleeping more and has gained 15 pounds since her last visit.  Today she denies SI/HI/VAH, mania, paranoia.    Patient informed writer that she continues to have right foot pain which she quantifies as a 7 out of 10.  She notes that she takes over-the-counter Aleve and recently received an injection to help manage  her pain.  She reports that she is followed by a doctor at [facility].  Patient reports that she feels that her depression is situational and at this time request that medications not be adjusted.  No medication changes made today.  Patient agreeable to continue medications as prescribed. She will follow-up with outpatient counseling for therapy.  No other concerns noted at this time.      Visit Diagnosis:    ICD-10-CM   1. Bipolar I disorder, most recent episode depressed  F31.30 ARIPiprazole (ABILIFY) 15 MG tablet    buPROPion (WELLBUTRIN XL) 150 MG 24 hr tablet    lamoTRIgine (LAMICTAL) 100 MG tablet    traZODone (DESYREL) 100 MG tablet    2. GAD (generalized anxiety disorder)  F41.1 traZODone (DESYREL) 100 MG tablet      Past Psychiatric History:  Bipolar 1, anxiety, depression, and PTSD.  Past Medical History:  Past Medical History:  Diagnosis Date   Bipolar 1 disorder ([place])    Heel spur    Hypertension    Osteogenesis imperfecta    PCOS (polycystic ovarian syndrome)    No past surgical history on file.  Family Psychiatric History: Unknown    Family History: No family history on file.  Social History:  Social History   Socioeconomic History   Marital status: Married    Spouse name: Not on file   Number of children: Not on file   Years of education: Not on file  Highest education level: Not on file  Occupational History   Not on file  Tobacco Use   Smoking status: Never   Smokeless tobacco: Never  Vaping Use   Vaping Use: Every day  Substance and Sexual Activity   Alcohol use: Yes   Drug use: Never   Sexual activity: Yes  Other Topics Concern   Not on file  Social History Narrative   Not on file   Social Determinants of Health   Financial Resource Strain: High Risk (03/06/2021)   Overall Financial Resource Strain (CARDIA)    Difficulty of Paying Living Expenses: Hard  Food Insecurity: Food Insecurity Present (03/06/2021)   Hunger Vital  Sign    Worried About Running Out of Food in the Last Year: Often true    Ran Out of Food in the Last Year: Sometimes true  Transportation Needs: No Transportation Needs (12/03/2020)   PRAPARE - Hydrologist (Medical): No    Lack of Transportation (Non-Medical): No  Physical Activity: Sufficiently Active (04/16/2020)   Exercise Vital Sign    Days of Exercise per Week: 3 days    Minutes of Exercise per Session: 60 min  Stress: Stress Concern Present (03/06/2021)   [place]    Feeling of Stress : To some extent  Social Connections: Moderately Isolated (03/06/2021)   Social Connection and Isolation Panel [NHANES]    Frequency of Communication with Friends and Family: More than three times a week    Frequency of Social Gatherings with Friends and Family: Once a week    Attends Religious Services: Never    Marine scientist or Organizations: No    Attends Archivist Meetings: Never    Marital Status: Married    Allergies:  Allergies  Allergen Reactions   Codeine    Iodine    Rocephin [Ceftriaxone Sodium In Dextrose]    Zofran [Ondansetron Hcl]     Metabolic Disorder Labs: Lab Results  Component Value Date   HGBA1C 4.8 10/22/2020   No results found for: "PROLACTIN" No results found for: "CHOL", "TRIG", "HDL", "CHOLHDL", "VLDL", "LDLCALC" Lab Results  Component Value Date   TSH 0.580 10/22/2020    Therapeutic Level Labs: No results found for: "LITHIUM" Lab Results  Component Value Date   VALPROATE 27 (L) 05/04/2020   No results found for: "CBMZ"  Current Medications: Current Outpatient Medications  Medication Sig Dispense Refill   ARIPiprazole (ABILIFY) 15 MG tablet Take 1 tablet (15 mg total) by mouth daily. 30 tablet 3   buPROPion (WELLBUTRIN XL) 150 MG 24 hr tablet Take 1 tablet (150 mg total) by mouth every morning. 30 tablet 3   lamoTRIgine (LAMICTAL) 100  MG tablet Take 1 tablet (100 mg total) by mouth daily. 30 tablet 3   metoprolol succinate (TOPROL-XL) 50 MG 24 hr tablet Take 1 tablet (50 mg total) by mouth daily. Take with or immediately following a meal. 30 tablet 6   Norgestimate-Ethinyl Estradiol Triphasic (ORTHO TRI-CYCLEN LO) 0.18/0.215/0.25 MG-25 MCG tab Take 1 tablet by mouth daily. 28 tablet 11   traZODone (DESYREL) 100 MG tablet Take 1 tablet (100 mg total) by mouth at bedtime as needed for sleep. 30 tablet 3   No current facility-administered medications for this visit.     Musculoskeletal: Strength & Muscle Tone:   telehealth visit camera off Gait & Station: normal,  telehealth visit camera off Patient leans: N/A  Psychiatric Specialty Exam:  Review of Systems  There were no vitals taken for this visit.There is no height or weight on file to calculate BMI.  General Appearance:  telehealth visit camera off  Eye Contact:   telehealth visit camera off  Speech:  Clear and Coherent and Normal Rate  Volume:  Normal  Mood:  Anxious and Depressed  Affect:  Appropriate and Congruent  Thought Process:  Coherent, Goal Directed and Linear  Orientation:  Full (Time, Place, and Person)  Thought Content: WDL and Logical   Suicidal Thoughts:  No  Homicidal Thoughts:  No  Memory:  Immediate;   Good Recent;   Good Remote;   Good  Judgement:  Good  Insight:  Good  Psychomotor Activity:  telehealth visit camera off  Concentration:  Concentration: Good and Attention Span: Good  Recall:  Good  Fund of Knowledge: Good  Language: Good  Akathisia:  telehealth visit camera off  Handed:  Right  AIMS (if indicated): Not done  Assets:  Communication Skills Desire for Improvement Financial Resources/Insurance Housing Intimacy Social Support  ADL's:  Intact  Cognition: WNL  Sleep:  Good   Screenings: AUDIT    Health and safety inspector from 11/28/2020 in [facility] Counselor from 04/16/2020 in [facility]  Alcohol Use Disorder Identification Test Final Score (AUDIT) 6 8      GAD-7    Flowsheet Row Video Visit from 09/15/2022 in [facility] Video Visit from 06/23/2022 in [facility] Video Visit from 03/13/2022 in [facility] Video Visit from 12/09/2021 in [facility] Video Visit from 08/08/2021 in [facility]  Total GAD-7 Score 12 2 0 0 1      PHQ2-9    Flowsheet Row Video Visit from 09/15/2022 in [facility] Video Visit from 06/23/2022 in [facility] Video Visit from 03/13/2022 in [facility] Video Visit from 12/09/2021 in [facility] Video Visit from 08/08/2021 in [facility]  PHQ-2 Total Score 4 0 0 0 0  PHQ-9 Total Score 12 2 0 2 0      Flowsheet Row ED from 12/16/2021 in [facility] Video Visit from 08/08/2021 in [facility] Video Visit from 05/21/2021 in [place] No Risk Error: Question 6 not populated Error: Q3, 4, or 5 should not be populated when Q2 is No        Assessment and Plan: Patient notes that she has situational depression and anxiety.  Patient reports that she feels that her depression is situational and at this time request that medications not be adjusted.  No medication changes made today.  Patient agreeable to continue medications as prescribed.   1. Bipolar I disorder, most recent episode depressed  Continue- ARIPiprazole (ABILIFY) 15 MG tablet; Take 1 tablet (15 mg total) by mouth daily.  Dispense: 30 tablet; Refill: 3 Continue- buPROPion (WELLBUTRIN XL) 150 MG 24 hr tablet; Take 1 tablet (150 mg total) by mouth every morning.  Dispense:  30 tablet; Refill: 3 Continue- lamoTRIgine (LAMICTAL) 100 MG tablet; Take 1 tablet (100 mg total) by mouth daily.  Dispense: 30 tablet; Refill: 3 Continue- traZODone (DESYREL) 100 MG tablet; Take 1 tablet (100 mg total) by mouth at bedtime as needed for sleep.  Dispense: 30 tablet;  Refill: 3  2. GAD (generalized anxiety disorder)  Continue- traZODone (DESYREL) 100 MG tablet; Take 1 tablet (100 mg total) by mouth at bedtime as needed for sleep.  Dispense: 30 tablet; Refill: 3        Follow-up in 3 months Follow-up with therapy  Salley Slaughter, NP 09/15/2022, 12:20 PM

## 2022-09-17 ENCOUNTER — Other Ambulatory Visit: Payer: Self-pay

## 2022-10-17 ENCOUNTER — Other Ambulatory Visit: Payer: Self-pay

## 2022-10-20 ENCOUNTER — Other Ambulatory Visit: Payer: Self-pay

## 2022-11-17 ENCOUNTER — Other Ambulatory Visit: Payer: Self-pay

## 2022-11-26 ENCOUNTER — Telehealth (HOSPITAL_COMMUNITY): Payer: No Payment, Other | Admitting: Psychiatry

## 2022-12-02 ENCOUNTER — Telehealth (INDEPENDENT_AMBULATORY_CARE_PROVIDER_SITE_OTHER): Payer: No Payment, Other | Admitting: Psychiatry

## 2022-12-02 ENCOUNTER — Encounter (HOSPITAL_COMMUNITY): Payer: Self-pay | Admitting: Psychiatry

## 2022-12-02 ENCOUNTER — Other Ambulatory Visit: Payer: Self-pay

## 2022-12-02 DIAGNOSIS — F3130 Bipolar disorder, current episode depressed, mild or moderate severity, unspecified: Secondary | ICD-10-CM

## 2022-12-02 DIAGNOSIS — F411 Generalized anxiety disorder: Secondary | ICD-10-CM | POA: Diagnosis not present

## 2022-12-02 MED ORDER — ARIPIPRAZOLE 15 MG PO TABS
15.0000 mg | ORAL_TABLET | Freq: Every day | ORAL | 3 refills | Status: DC
  Filled 2022-12-02 – 2022-12-15 (×2): qty 30, 30d supply, fill #0
  Filled 2023-01-13 (×2): qty 30, 30d supply, fill #1

## 2022-12-02 MED ORDER — TRAZODONE HCL 100 MG PO TABS
100.0000 mg | ORAL_TABLET | Freq: Every evening | ORAL | 3 refills | Status: DC | PRN
  Filled 2022-12-02: qty 30, 30d supply, fill #0

## 2022-12-02 MED ORDER — BUPROPION HCL ER (XL) 150 MG PO TB24
150.0000 mg | ORAL_TABLET | Freq: Every morning | ORAL | 3 refills | Status: DC
  Filled 2022-12-02 – 2022-12-15 (×2): qty 30, 30d supply, fill #0
  Filled 2023-01-13 (×2): qty 30, 30d supply, fill #1

## 2022-12-02 MED ORDER — LAMOTRIGINE 150 MG PO TABS
150.0000 mg | ORAL_TABLET | Freq: Every day | ORAL | 3 refills | Status: DC
  Filled 2022-12-02: qty 30, 30d supply, fill #0
  Filled 2022-12-31: qty 30, 30d supply, fill #1

## 2022-12-02 NOTE — Progress Notes (Signed)
BH MD/PA/NP OP Progress Note Virtual Visit via Video Note  I connected with Sara Pearson on 12/02/22 at  3:30 PM EDT by a video enabled telemedicine application and verified that I am speaking with the correct person using two identifiers.  Location: Patient: Home Provider: Clinic   I discussed the limitations of evaluation and management by telemedicine and the availability of in person appointments. The patient expressed understanding and agreed to proceed.  I provided 30 minutes of non-face-to-face time during this encounter.      12/02/2022 3:31 PM Sara Pearson  MRN:  [id number]  Chief Complaint: "I have been irritable"  HPI: 37 year old female seen today for followup psychiatric evaluation. She has a psychiatric history of Bipolar 1, anxiety, depression, and PTSD. She is currently managed on Trazodone 100 mg nightly, Abilify 15 mg daily, Lamictal 100 mg daily, and Wellbutrin XL 150 mg daily.  Today she notes her medications are somewhat effective in managing her psychiatric conditions.     Today she was well-groomed, pleasant, cooperative, and engaged in conversation.  She informed Clinical research associate that she be irritable.  She notes that work has been stressful.  Recently she notes that she had inventory at work that was overwhelming.  She also notes that her apartment was recently recertified which was stressful.  Patient endorses symptoms of hypomania today such as distractibility, irritability, fluctuations in mood, and decreased need for sleep.  She informed Clinical research associate that at times she goes 2 days without sleeping and other days she sleeps excessively. At times she notes that she is increasingly fatigued.   Patient also informed writer that since her last visit she has been more anxious and depressed.  Provider conducted a GAD-7 and patient scored a 13, at her last visit she scored a 12.  Provider also conducted a PHQ-9 and patient scores a 19, at her last visit she scored a 12.  She endorses  fluctuations in sleep and appetite.  Today she denies SI/HI/VAH or paranoia.    Patient informed writer that her foot pain have subsided.  She followed up with a podiatrist and notes that she was given an insole.  Today patient agreeable to increasing Lamictal 100 mg to 150 mg to help manage mood.  She will continue all other medications as prescribed.  Provider informed patient that if mood does not improve Abilify could be adjusted.  She endorsed understanding and agreed. She will follow-up with outpatient counseling for therapy.  Today provider ordered CBC, CMP, HgbA1c, thyroid panel, prolactin level, lipid panel, UDS, and vitamin D.  No other concerns noted at this time.      Visit Diagnosis:  No diagnosis found.   Past Psychiatric History:  Bipolar 1, anxiety, depression, and PTSD.  Past Medical History:  Past Medical History:  Diagnosis Date   Bipolar 1 disorder (HCC)    Heel spur    Hypertension    Osteogenesis imperfecta    PCOS (polycystic ovarian syndrome)    No past surgical history on file.  Family Psychiatric History: Unknown    Family History: No family history on file.  Social History:  Social History   Socioeconomic History   Marital status: Married    Spouse name: Not on file   Number of children: Not on file   Years of education: Not on file   Highest education level: Not on file  Occupational History   Not on file  Tobacco Use   Smoking status: Never   Smokeless  tobacco: Never  Vaping Use   Vaping Use: Every day  Substance and Sexual Activity   Alcohol use: Yes   Drug use: Never   Sexual activity: Yes  Other Topics Concern   Not on file  Social History Narrative   Not on file   Social Determinants of Health   Financial Resource Strain: High Risk (03/06/2021)   Overall Financial Resource Strain (CARDIA)    Difficulty of Paying Living Expenses: Hard  Food Insecurity: Food Insecurity Present (03/06/2021)   Hunger Vital Sign    Worried About  Running Out of Food in the Last Year: Often true    Ran Out of Food in the Last Year: Sometimes true  Transportation Needs: No Transportation Needs (12/03/2020)   PRAPARE - Administrator, Civil Service (Medical): No    Lack of Transportation (Non-Medical): No  Physical Activity: Sufficiently Active (04/16/2020)   Exercise Vital Sign    Days of Exercise per Week: 3 days    Minutes of Exercise per Session: 60 min  Stress: Stress Concern Present (03/06/2021)   [employer] of Occupational Health - Occupational Stress Questionnaire    Feeling of Stress : To some extent  Social Connections: Moderately Isolated (03/06/2021)   Social Connection and Isolation Panel [NHANES]    Frequency of Communication with Friends and Family: More than three times a week    Frequency of Social Gatherings with Friends and Family: Once a week    Attends Religious Services: Never    Database administrator or Organizations: No    Attends Banker Meetings: Never    Marital Status: Married    Allergies:  Allergies  Allergen Reactions   Codeine    Iodine    Rocephin [Ceftriaxone Sodium In Dextrose]    Zofran [Ondansetron Hcl]     Metabolic Disorder Labs: Lab Results  Component Value Date   HGBA1C 4.8 10/22/2020   No results found for: "PROLACTIN" No results found for: "CHOL", "TRIG", "HDL", "CHOLHDL", "VLDL", "LDLCALC" Lab Results  Component Value Date   TSH 0.580 10/22/2020    Therapeutic Level Labs: No results found for: "LITHIUM" Lab Results  Component Value Date   VALPROATE 27 (L) 05/04/2020   No results found for: "CBMZ"  Current Medications: Current Outpatient Medications  Medication Sig Dispense Refill   ARIPiprazole (ABILIFY) 15 MG tablet Take 1 tablet (15 mg total) by mouth daily. 30 tablet 3   buPROPion (WELLBUTRIN XL) 150 MG 24 hr tablet Take 1 tablet (150 mg total) by mouth every morning. 30 tablet 3   lamoTRIgine (LAMICTAL) 100 MG tablet Take 1 tablet  (100 mg total) by mouth daily. 30 tablet 3   metoprolol succinate (TOPROL-XL) 50 MG 24 hr tablet Take 1 tablet (50 mg total) by mouth daily. Take with or immediately following a meal. 30 tablet 6   Norgestimate-Ethinyl Estradiol Triphasic (ORTHO TRI-CYCLEN LO) 0.18/0.215/0.25 MG-25 MCG tab Take 1 tablet by mouth daily. 28 tablet 11   traZODone (DESYREL) 100 MG tablet Take 1 tablet (100 mg total) by mouth at bedtime as needed for sleep. 30 tablet 3   No current facility-administered medications for this visit.     Musculoskeletal: Strength & Muscle Tone: within normal limits and  telehealth visit Gait & Station: normal,  telehealth visit Patient leans: N/A  Psychiatric Specialty Exam: Review of Systems  There were no vitals taken for this visit.There is no height or weight on file to calculate BMI.  General Appearance: Well  Groomed  Eye Contact:  Good  Speech:  Clear and Coherent and Normal Rate  Volume:  Normal  Mood:  Anxious and Depressed  Affect:  Appropriate and Congruent  Thought Process:  Coherent, Goal Directed and Linear  Orientation:  Full (Time, Place, and Person)  Thought Content: WDL and Logical   Suicidal Thoughts:  No  Homicidal Thoughts:  No  Memory:  Immediate;   Good Recent;   Good Remote;   Good  Judgement:  Good  Insight:  Good  Psychomotor Activity:  Normal  Concentration:  Concentration: Good and Attention Span: Good  Recall:  Good  Fund of Knowledge: Good  Language: Good  Akathisia:  None  Handed:  Right  AIMS (if indicated): Not done  Assets:  Communication Skills Desire for Improvement Financial Resources/Insurance Housing Intimacy Social Support  ADL's:  Intact  Cognition: WNL  Sleep:  Good   Screenings: AUDIT    Advertising copywriter from 11/28/2020 in [facility] Counselor from 04/16/2020 in [facility]  Alcohol Use Disorder Identification Test Final Score (AUDIT) 6 8       GAD-7    Flowsheet Row Video Visit from 09/15/2022 in [facility] Video Visit from 06/23/2022 in [facility] Video Visit from 03/13/2022 in [facility] Video Visit from 12/09/2021 in [facility] Video Visit from 08/08/2021 in [facility]  Total GAD-7 Score 12 2 0 0 1      PHQ2-9    Flowsheet Row Video Visit from 09/15/2022 in [facility] Video Visit from 06/23/2022 in [facility] Video Visit from 03/13/2022 in [facility] Video Visit from 12/09/2021 in [facility] Video Visit from 08/08/2021 in [facility]  PHQ-2 Total Score 4 0 0 0 0  PHQ-9 Total Score 12 2 0 2 0      Flowsheet Row ED from 12/16/2021 in [facility] Emergency Department at [facility] Video Visit from 08/08/2021 in [facility] Video Visit from 05/21/2021 in [facility]  C-SSRS RISK CATEGORY No Risk Error: Question 6 not populated Error: Q3, 4, or 5 should not be populated when Q2 is No        Assessment and Plan: Patient endorses increase anxiety, depression, and symptoms of hypomania. Today patient agreeable to increasing Lamictal 100 mg to 150 mg to help manage mood.  She will continue all other medications as prescribed.  Provider informed patient that if mood does not improve Abilify could be adjusted.  She endorsed understanding and agreed. Today provider ordered CBC, CMP, HgbA1c, thyroid panel, prolactin level, lipid panel, UDS, and vitamin D  1. Bipolar I disorder, most recent episode depressed (HCC)  - Vitamin D (25 hydroxy) - CBC w/Diff/Platelet - Lipid Profile - Thyroid Panel With TSH - Comprehensive Metabolic Panel (CMET) - Rapid urine drug screen (hospital performed);  Future - Prolactin - HgB A1c - ARIPiprazole (ABILIFY) 15 MG tablet; Take 1 tablet (15 mg total) by mouth daily.  Dispense: 30 tablet; Refill: 3 Increased- lamoTRIgine (LAMICTAL) 150 MG tablet; Take 1 tablet (150 mg total) by mouth daily.  Dispense: 30 tablet; Refill: 3 Continue- buPROPion (WELLBUTRIN XL) 150 MG 24 hr tablet; Take 1 tablet (150 mg total) by mouth every morning.  Dispense: 30 tablet; Refill: 3 Continue- traZODone (DESYREL) 100  MG tablet; Take 1 tablet (100 mg total) by mouth at bedtime as needed for sleep.  Dispense: 30 tablet; Refill: 3  2. GAD (generalized anxiety disorder)  Continue- traZODone (DESYREL) 100 MG tablet; Take 1 tablet (100 mg total) by mouth at bedtime as needed for sleep.  Dispense: 30 tablet; Refill: 3       Follow-up in 3 months Follow-up with therapy  Shanna Cisco, NP 12/02/2022, 3:31 PM

## 2022-12-05 ENCOUNTER — Other Ambulatory Visit: Payer: Self-pay

## 2022-12-15 ENCOUNTER — Other Ambulatory Visit: Payer: Self-pay

## 2022-12-17 ENCOUNTER — Other Ambulatory Visit: Payer: Self-pay

## 2022-12-31 ENCOUNTER — Other Ambulatory Visit: Payer: Self-pay

## 2023-01-01 ENCOUNTER — Other Ambulatory Visit: Payer: Self-pay

## 2023-01-13 ENCOUNTER — Other Ambulatory Visit: Payer: Self-pay

## 2023-01-16 ENCOUNTER — Other Ambulatory Visit: Payer: Self-pay

## 2023-01-19 ENCOUNTER — Other Ambulatory Visit: Payer: Self-pay

## 2023-02-04 ENCOUNTER — Other Ambulatory Visit: Payer: Self-pay

## 2023-02-04 ENCOUNTER — Other Ambulatory Visit (HOSPITAL_COMMUNITY): Payer: Self-pay

## 2023-02-25 ENCOUNTER — Telehealth (HOSPITAL_COMMUNITY): Payer: No Payment, Other | Admitting: Psychiatry

## 2023-02-26 ENCOUNTER — Encounter (HOSPITAL_COMMUNITY): Payer: Self-pay | Admitting: Psychiatry

## 2023-02-26 ENCOUNTER — Telehealth (INDEPENDENT_AMBULATORY_CARE_PROVIDER_SITE_OTHER): Payer: No Payment, Other | Admitting: Psychiatry

## 2023-02-26 DIAGNOSIS — F3130 Bipolar disorder, current episode depressed, mild or moderate severity, unspecified: Secondary | ICD-10-CM | POA: Diagnosis not present

## 2023-02-26 MED ORDER — LAMOTRIGINE 150 MG PO TABS: 150.0000 mg | ORAL_TABLET | Freq: Every day | ORAL | 3 refills | Status: DC

## 2023-02-26 MED ORDER — ARIPIPRAZOLE 15 MG PO TABS: 15.0000 mg | ORAL_TABLET | Freq: Every day | ORAL | 3 refills | Status: DC

## 2023-02-26 MED ORDER — BUPROPION HCL ER (XL) 150 MG PO TB24: 150.0000 mg | ORAL_TABLET | Freq: Every morning | ORAL | 3 refills | Status: DC

## 2023-02-26 NOTE — Progress Notes (Signed)
BH MD/PA/NP OP Progress Note Virtual Visit via Video Note  I connected with Sara Pearson on 02/26/23 at  3:30 PM EDT by a video enabled telemedicine application and verified that I am speaking with the correct person using two identifiers.  Location: Patient: Home Provider: Clinic   I discussed the limitations of evaluation and management by telemedicine and the availability of in person appointments. The patient expressed understanding and agreed to proceed.  I provided 30 minutes of non-face-to-face time during this encounter.      02/26/2023 3:30 PM Sara Pearson  MRN:  [id number]  Chief Complaint: "I am no longer depressed"  HPI: 37 year old female seen today for followup psychiatric evaluation. She has a psychiatric history of Bipolar 1, anxiety, depression, and PTSD. She is currently managed on Trazodone 100 mg nightly as needed, Abilify 15 mg daily, Lamictal 150 mg daily, and Wellbutrin XL 150 mg daily.  She reports that she has not needed trazodone.  Today she notes her medications are effective in managing her psychiatric conditions.     Today she was well-groomed, pleasant, cooperative, and engaged in conversation.  She informed Clinical research associate that she is no longer depressed.  She informed Clinical research associate that the increase in Lamictal was effective.  Patient notes that her mood is more stable and notes that her anxiety and depression are better managed.  Today provider conducted a GAD-7 and patient scored a 1, at her last visit she scored a 13.  Provider also conducted PHQ-9 patient scored a 0, at her last visit she scored a 19.  She endorses adequate sleep and appetite.  Since her last visit she informed writer that she has lost 5 pounds.  Today she denies SI/HI/VAH or paranoia.    Patient informed Clinical research associate that she is no longer stressed about her housing as she renewed her lease for a year.    Patient notes that she continues to have back and foot pain.  She quantifies her pain today as 6 out  of 10 and notes that she takes Tylenol to help manage her pain.    Patient informed Clinical research associate that she was unable to get her labs done.  She recently went to [facility] and had a few labs taken.  Patient white blood cells were elevated but other labs were within normal limits.  She informed Clinical research associate that in the future when she can afford to take get her other labs taken she will.  Patient informed Clinical research associate that she has not been taking trazodone.  At this time she does not wish to restart it.  She will continue all other medications as prescribed.  No other concerns noted at this time.        Visit Diagnosis:  No diagnosis found.   Past Psychiatric History:  Bipolar 1, anxiety, depression, and PTSD.  Past Medical History:  Past Medical History:  Diagnosis Date   Bipolar 1 disorder (HCC)    Heel spur    Hypertension    Osteogenesis imperfecta    PCOS (polycystic ovarian syndrome)    No past surgical history on file.  Family Psychiatric History: Unknown    Family History: No family history on file.  Social History:  Social History   Socioeconomic History   Marital status: Married    Spouse name: Not on file   Number of children: Not on file   Years of education: Not on file   Highest education level: Not on file  Occupational History  Not on file  Tobacco Use   Smoking status: Never   Smokeless tobacco: Never  Vaping Use   Vaping status: Every Day  Substance and Sexual Activity   Alcohol use: Yes   Drug use: Never   Sexual activity: Yes  Other Topics Concern   Not on file  Social History Narrative   Not on file   Social Determinants of Health   Financial Resource Strain: High Risk (03/06/2021)   Overall Financial Resource Strain (CARDIA)    Difficulty of Paying Living Expenses: Hard  Food Insecurity: Food Insecurity Present (03/06/2021)   Hunger Vital Sign    Worried About Running Out of Food in the Last Year: Often true    Ran Out of Food in the Last Year:  Sometimes true  Transportation Needs: No Transportation Needs (12/03/2020)   PRAPARE - Administrator, Civil Service (Medical): No    Lack of Transportation (Non-Medical): No  Physical Activity: Sufficiently Active (04/16/2020)   Exercise Vital Sign    Days of Exercise per Week: 3 days    Minutes of Exercise per Session: 60 min  Stress: Stress Concern Present (03/06/2021)   [employer] of Occupational Health - Occupational Stress Questionnaire    Feeling of Stress : To some extent  Social Connections: Unknown (10/29/2021)   Received from [facility]   Social Network    Social Network: Not on file    Allergies:  Allergies  Allergen Reactions   Codeine    Iodine    Rocephin [Ceftriaxone Sodium In Dextrose]    Zofran [Ondansetron Hcl]     Metabolic Disorder Labs: Lab Results  Component Value Date   HGBA1C 4.8 10/22/2020   No results found for: "PROLACTIN" No results found for: "CHOL", "TRIG", "HDL", "CHOLHDL", "VLDL", "LDLCALC" Lab Results  Component Value Date   TSH 0.580 10/22/2020    Therapeutic Level Labs: No results found for: "LITHIUM" Lab Results  Component Value Date   VALPROATE 27 (L) 05/04/2020   No results found for: "CBMZ"  Current Medications: Current Outpatient Medications  Medication Sig Dispense Refill   ARIPiprazole (ABILIFY) 15 MG tablet Take 1 tablet (15 mg total) by mouth daily. 30 tablet 3   buPROPion (WELLBUTRIN XL) 150 MG 24 hr tablet Take 1 tablet (150 mg total) by mouth every morning. 30 tablet 3   lamoTRIgine (LAMICTAL) 150 MG tablet Take 1 tablet (150 mg total) by mouth daily. 30 tablet 3   Norgestimate-Ethinyl Estradiol Triphasic (ORTHO TRI-CYCLEN LO) 0.18/0.215/0.25 MG-25 MCG tab Take 1 tablet by mouth daily. 28 tablet 11   traZODone (DESYREL) 100 MG tablet Take 1 tablet (100 mg total) by mouth at bedtime as needed for sleep. 30 tablet 3   No current facility-administered medications for this visit.      Musculoskeletal: Strength & Muscle Tone: within normal limits and  telehealth visit Gait & Station: normal,  telehealth visit Patient leans: N/A  Psychiatric Specialty Exam: Review of Systems  There were no vitals taken for this visit.There is no height or weight on file to calculate BMI.  General Appearance: Well Groomed  Eye Contact:  Good  Speech:  Clear and Coherent and Normal Rate  Volume:  Normal  Mood:  Euthymic  Affect:  Appropriate and Congruent  Thought Process:  Coherent, Goal Directed and Linear  Orientation:  Full (Time, Place, and Person)  Thought Content: WDL and Logical   Suicidal Thoughts:  No  Homicidal Thoughts:  No  Memory:  Immediate;  Good Recent;   Good Remote;   Good  Judgement:  Good  Insight:  Good  Psychomotor Activity:  Normal  Concentration:  Concentration: Good and Attention Span: Good  Recall:  Good  Fund of Knowledge: Good  Language: Good  Akathisia:  None  Handed:  Right  AIMS (if indicated): Not done  Assets:  Communication Skills Desire for Improvement Financial Resources/Insurance Housing Intimacy Social Support  ADL's:  Intact  Cognition: WNL  Sleep:  Good   Screenings: AUDIT    Advertising copywriter from 11/28/2020 in [facility] Counselor from 04/16/2020 in [facility]  Alcohol Use Disorder Identification Test Final Score (AUDIT) 6 8      GAD-7    Flowsheet Row Video Visit from 12/02/2022 in [facility] Video Visit from 09/15/2022 in [facility] Video Visit from 06/23/2022 in [facility] Video Visit from 03/13/2022 in [facility] Video Visit from 12/09/2021 in [facility]  Total GAD-7 Score 13 12 2  0 0      PHQ2-9    Flowsheet Row Video Visit from 12/02/2022 in [facility] Video Visit from  09/15/2022 in [facility] Video Visit from 06/23/2022 in [facility] Video Visit from 03/13/2022 in [facility] Video Visit from 12/09/2021 in [facility]  PHQ-2 Total Score 5 4 0 0 0  PHQ-9 Total Score 19 12 2  0 2      Flowsheet Row ED from 12/16/2021 in [facility] Emergency [facility] at [facility] Video Visit from 08/08/2021 in [facility] Video Visit from 05/21/2021 in [facility]  C-SSRS RISK CATEGORY No Risk Error: Question 6 not populated Error: Q3, 4, or 5 should not be populated when Q2 is No        Assessment and Plan: Patient reports that her anxiety and depression are overall better managed.  She also notes that her mood is stable.Patient informed Clinical research associate that she was unable to get her labs done.  She recently went to [place] [facility] and had a few labs taken.  Patient white blood cells were elevated but other labs were within normal limits.  She informed Clinical research associate that in the future when she can afford to take get her other labs taken she will.  Patient informed Clinical research associate that she has not been taking trazodone.  At this time she does not wish to restart it.  She will continue all other medications as prescribed  1. Bipolar I disorder, most recent episode depressed (HCC)  Continue- ARIPiprazole (ABILIFY) 15 MG tablet; Take 1 tablet (15 mg total) by mouth daily.  Dispense: 30 tablet; Refill: 3 Continue- buPROPion (WELLBUTRIN XL) 150 MG 24 hr tablet; Take 1 tablet (150 mg total) by mouth every morning.  Dispense: 30 tablet; Refill: 3 Continue- lamoTRIgine (LAMICTAL) 150 MG tablet; Take 1 tablet (150 mg total) by mouth daily.  Dispense: 30 tablet; Refill: 3       Follow-up in 2 months Follow-up with therapy  Shanna Cisco, NP 02/26/2023, 3:30 PM

## 2023-05-05 ENCOUNTER — Telehealth (HOSPITAL_COMMUNITY): Payer: No Payment, Other | Admitting: Psychiatry

## 2023-05-08 ENCOUNTER — Telehealth (INDEPENDENT_AMBULATORY_CARE_PROVIDER_SITE_OTHER): Payer: No Payment, Other | Admitting: Psychiatry

## 2023-05-08 ENCOUNTER — Encounter (HOSPITAL_COMMUNITY): Payer: Self-pay | Admitting: Psychiatry

## 2023-05-08 DIAGNOSIS — F3130 Bipolar disorder, current episode depressed, mild or moderate severity, unspecified: Secondary | ICD-10-CM | POA: Diagnosis not present

## 2023-05-08 MED ORDER — LAMOTRIGINE 150 MG PO TABS: 150.0000 mg | ORAL_TABLET | Freq: Every day | ORAL | 3 refills | Status: DC

## 2023-05-08 MED ORDER — BUPROPION HCL ER (XL) 150 MG PO TB24: 150.0000 mg | ORAL_TABLET | Freq: Every morning | ORAL | 3 refills | Status: DC

## 2023-05-08 MED ORDER — ARIPIPRAZOLE 15 MG PO TABS: 15.0000 mg | ORAL_TABLET | Freq: Every day | ORAL | 3 refills | Status: DC

## 2023-05-08 NOTE — Progress Notes (Signed)
BH MD/PA/NP OP Progress Note Virtual Visit via Video Note  I connected with Sara Pearson on 05/08/23 at 11:30 AM EST by a video enabled telemedicine application and verified that I am speaking with the correct person using two identifiers.  Location: Patient: Home Provider: Clinic   I discussed the limitations of evaluation and management by telemedicine and the availability of in person appointments. The patient expressed understanding and agreed to proceed.  I provided 30 minutes of non-face-to-face time during this encounter.      05/08/2023 11:26 AM Sara Pearson  MRN:  [id number]  Chief Complaint: "Everything is going well"  HPI: 37 year old female seen today for followup psychiatric evaluation. She has a psychiatric history of Bipolar 1, anxiety, depression, and PTSD. She is currently managed on Abilify 15 mg daily, Lamictal 150 mg daily, and Wellbutrin XL 150 mg daily.  She reports that her medications are effective in managing her psychiatric conditions.     Today she was well-groomed, pleasant, cooperative, and engaged in conversation.  She informed Clinical research associate that she everything is going well.  Patient notes that yesterday she celebrated her 10-year anniversary with her partner.  Patient informed Clinical research associate that work is going well.  She informed Clinical research associate that her mood is stable and notes that she has minimal anxiety and depression.  Provider conducted a GAD-7 and patient scored a 2, her last visit she scored 1. Provider also conducted PHQ-9 patient scored a 2, at her last visit she scored a 0.  She endorses adequate sleep and appetite.  Since her last visit she informed writer that she has lost 15 pounds by walking.  Today she denies SI/HI/VAH or paranoia.    Patient informed writer she has planta fasciitis.  She quantifies her pain is 6 out of 10 and at times it radiates to her knees and her back.  She reports that Advil and Aleve is effective in managing this pain.  No medication  changes made today.  Patient agreeable to taking medication as prescribed.  No other concerns noted at this time.        Visit Diagnosis:    ICD-10-CM   1. Bipolar I disorder, most recent episode depressed (HCC)  F31.30 ARIPiprazole (ABILIFY) 15 MG tablet    buPROPion (WELLBUTRIN XL) 150 MG 24 hr tablet    lamoTRIgine (LAMICTAL) 150 MG tablet       Past Psychiatric History:  Bipolar 1, anxiety, depression, and PTSD.  Past Medical History:  Past Medical History:  Diagnosis Date   Bipolar 1 disorder (HCC)    Heel spur    Hypertension    Osteogenesis imperfecta    PCOS (polycystic ovarian syndrome)    History reviewed. No pertinent surgical history.  Family Psychiatric History: Unknown    Family History: History reviewed. No pertinent family history.  Social History:  Social History   Socioeconomic History   Marital status: Married    Spouse name: Not on file   Number of children: Not on file   Years of education: Not on file   Highest education level: Not on file  Occupational History   Not on file  Tobacco Use   Smoking status: Never   Smokeless tobacco: Never  Vaping Use   Vaping status: Every Day  Substance and Sexual Activity   Alcohol use: Yes   Drug use: Never   Sexual activity: Yes  Other Topics Concern   Not on file  Social History Narrative   Not on  file   Social Determinants of Health   Financial Resource Strain: High Risk (03/06/2021)   Overall Financial Resource Strain (CARDIA)    Difficulty of Paying Living Expenses: Hard  Food Insecurity: Food Insecurity Present (03/06/2021)   Hunger Vital Sign    Worried About Running Out of Food in the Last Year: Often true    Ran Out of Food in the Last Year: Sometimes true  Transportation Needs: No Transportation Needs (12/03/2020)   PRAPARE - Administrator, Civil Service (Medical): No    Lack of Transportation (Non-Medical): No  Physical Activity: Sufficiently Active (04/16/2020)    Exercise Vital Sign    Days of Exercise per Week: 3 days    Minutes of Exercise per Session: 60 min  Stress: Stress Concern Present (03/06/2021)   [employer] of Occupational Health - Occupational Stress Questionnaire    Feeling of Stress : To some extent  Social Connections: Unknown (10/29/2021)   Received from [facility]   Social Network    Social Network: Not on file    Allergies:  Allergies  Allergen Reactions   Codeine    Iodine    Rocephin [Ceftriaxone Sodium In Dextrose]    Zofran [Ondansetron Hcl]     Metabolic Disorder Labs: Lab Results  Component Value Date   HGBA1C 4.8 10/22/2020   No results found for: "PROLACTIN" No results found for: "CHOL", "TRIG", "HDL", "CHOLHDL", "VLDL", "LDLCALC" Lab Results  Component Value Date   TSH 0.580 10/22/2020    Therapeutic Level Labs: No results found for: "LITHIUM" Lab Results  Component Value Date   VALPROATE 27 (L) 05/04/2020   No results found for: "CBMZ"  Current Medications: Current Outpatient Medications  Medication Sig Dispense Refill   ARIPiprazole (ABILIFY) 15 MG tablet Take 1 tablet (15 mg total) by mouth daily. 30 tablet 3   buPROPion (WELLBUTRIN XL) 150 MG 24 hr tablet Take 1 tablet (150 mg total) by mouth every morning. 30 tablet 3   lamoTRIgine (LAMICTAL) 150 MG tablet Take 1 tablet (150 mg total) by mouth daily. 30 tablet 3   Norgestimate-Ethinyl Estradiol Triphasic (ORTHO TRI-CYCLEN LO) 0.18/0.215/0.25 MG-25 MCG tab Take 1 tablet by mouth daily. 28 tablet 11   No current facility-administered medications for this visit.     Musculoskeletal: Strength & Muscle Tone: within normal limits and  telehealth visit Gait & Station: normal,  telehealth visit Patient leans: N/A  Psychiatric Specialty Exam: Review of Systems  There were no vitals taken for this visit.There is no height or weight on file to calculate BMI.  General Appearance: Well Groomed  Eye Contact:  Good  Speech:   Clear and Coherent and Normal Rate  Volume:  Normal  Mood:  Euthymic  Affect:  Appropriate and Congruent  Thought Process:  Coherent, Goal Directed and Linear  Orientation:  Full (Time, Place, and Person)  Thought Content: WDL and Logical   Suicidal Thoughts:  No  Homicidal Thoughts:  No  Memory:  Immediate;   Good Recent;   Good Remote;   Good  Judgement:  Good  Insight:  Good  Psychomotor Activity:  Normal  Concentration:  Concentration: Good and Attention Span: Good  Recall:  Good  Fund of Knowledge: Good  Language: Good  Akathisia:  None  Handed:  Right  AIMS (if indicated): Not done  Assets:  Communication Skills Desire for Improvement Financial Resources/Insurance Housing Intimacy Social Support  ADL's:  Intact  Cognition: WNL  Sleep:  Good  Screenings: AUDIT    Advertising copywriter from 11/28/2020 in [facility] Counselor from 04/16/2020 in [facility]  Alcohol Use Disorder Identification Test Final Score (AUDIT) 6 8      GAD-7    Flowsheet Row Video [facility] from 05/08/2023 in [facility] Video Visit from 02/26/2023 in [facility] Video Visit from 12/02/2022 in [facility] Video Visit from 09/15/2022 in [facility] Video Visit from 06/23/2022 in [facility]  Total GAD-7 Score 2 1 13 12 2       PHQ2-9    Flowsheet Row Video Visit from 05/08/2023 in [facility] Video Visit from 02/26/2023 in [facility] Video Visit from 12/02/2022 in [facility] Video Visit from 09/15/2022 in [facility] Video Visit from 06/23/2022 in [facility]  PHQ-2 Total Score 0 0 5 4 0  PHQ-9 Total Score 2 0 19 12 2       Flowsheet Row ED from 12/16/2021 in  [facility] at [facility] Video Visit from 08/08/2021 in [facility] Video Visit from 05/21/2021 in [facility]  C-SSRS RISK CATEGORY No Risk Error: Question 6 not populated Error: Q3, 4, or 5 should not be populated when Q2 is No        Assessment and Plan: Patient reports that she is doing well on her current medication regimen.  No medication changes made today.  Patient agreeable to continue medications as prescribed.  1. Bipolar I disorder, most recent episode depressed (HCC)  Continue- ARIPiprazole (ABILIFY) 15 MG tablet; Take 1 tablet (15 mg total) by mouth daily.  Dispense: 30 tablet; Refill: 3 Continue- buPROPion (WELLBUTRIN XL) 150 MG 24 hr tablet; Take 1 tablet (150 mg total) by mouth every morning.  Dispense: 30 tablet; Refill: 3 Continue- lamoTRIgine (LAMICTAL) 150 MG tablet; Take 1 tablet (150 mg total) by mouth daily.  Dispense: 30 tablet; Refill: 3       Follow-up in 2 months Follow-up with therapy  Shanna Cisco, NP 05/08/2023, 11:26 AM

## 2023-07-10 ENCOUNTER — Telehealth (INDEPENDENT_AMBULATORY_CARE_PROVIDER_SITE_OTHER): Payer: No Payment, Other | Admitting: Psychiatry

## 2023-07-10 ENCOUNTER — Encounter (HOSPITAL_COMMUNITY): Payer: Self-pay | Admitting: Psychiatry

## 2023-07-10 DIAGNOSIS — F3130 Bipolar disorder, current episode depressed, mild or moderate severity, unspecified: Secondary | ICD-10-CM

## 2023-07-10 MED ORDER — BUPROPION HCL ER (XL) 150 MG PO TB24: 150.0000 mg | ORAL_TABLET | Freq: Every morning | ORAL | 3 refills | Status: DC

## 2023-07-10 MED ORDER — LAMOTRIGINE 150 MG PO TABS: 150.0000 mg | ORAL_TABLET | Freq: Every day | ORAL | 3 refills | Status: DC

## 2023-07-10 MED ORDER — ARIPIPRAZOLE 15 MG PO TABS: 15.0000 mg | ORAL_TABLET | Freq: Every day | ORAL | 3 refills | Status: DC

## 2023-07-10 NOTE — Progress Notes (Signed)
BH MD/PA/NP OP Progress Note Virtual Visit via Video Note  I connected with Sara Pearson on 07/10/23 at 11:00 AM EST by a video enabled telemedicine application and verified that I am speaking with the correct person using two identifiers.  Location: Patient: Home Provider: Clinic   I discussed the limitations of evaluation and management by telemedicine and the availability of in person appointments. The patient expressed understanding and agreed to proceed.  I provided 30 minutes of non-face-to-face time during this encounter.      07/10/2023 11:13 AM Sara Pearson  MRN:  [id number]  Chief Complaint: "Im doing okay"  HPI: 38 year old female seen today for followup psychiatric evaluation. She has a psychiatric history of Bipolar 1, anxiety, depression, and PTSD. She is currently managed on Abilify 15 mg daily, Lamictal 150 mg daily, and Wellbutrin XL 150 mg daily.  She reports that her medications are effective in managing her psychiatric conditions.     Today she was well-groomed, pleasant, cooperative, and engaged in conversation.  She informed Clinical research associate that she is doing okay.  She reports that her mood continues to be stable and denies symptoms of anxiety and depression.  Today provider conducted a GAD-7 and patient scored a 0, her last visit she scored 2.  But also conducted PHQ-9 patient scored a 0, at her last visit she scored a 2.  She endorsed adequate sleep and appetite.  Today she denies SI/HI/VAH, mania, paranoia.    Patient reports that she continues to have feet pain which she quantifies as 5 out of 10.  She notes that she takes naproxen to help manage his pain.    Patient does not have recent routine labs.  Today provider ordered prolactin level, CBC, CMP, LFT, thyroid level, lamotrigine level, and HgbA1c.  No medication changes made today.  Patient agreeable to continue medications as prescribed.  No other concerns noted at this time      Visit Diagnosis:     ICD-10-CM   1. Bipolar I disorder, most recent episode depressed (HCC)  F31.30 ARIPiprazole (ABILIFY) 15 MG tablet    buPROPion (WELLBUTRIN XL) 150 MG 24 hr tablet    lamoTRIgine (LAMICTAL) 150 MG tablet    Lamotrigine level    CBC w/Diff/Platelet    Comprehensive Metabolic Panel (CMET)    Hepatic function panel    Prolactin    HgB A1c    Thyroid Panel With TSH        Past Psychiatric History:  Bipolar 1, anxiety, depression, and PTSD.  Past Medical History:  Past Medical History:  Diagnosis Date   Bipolar 1 disorder (HCC)    Heel spur    Hypertension    Osteogenesis imperfecta    PCOS (polycystic ovarian syndrome)    History reviewed. No pertinent surgical history.  Family Psychiatric History: Unknown    Family History: History reviewed. No pertinent family history.  Social History:  Social History   Socioeconomic History   Marital status: Married    Spouse name: Not on file   Number of children: Not on file   Years of education: Not on file   Highest education level: Not on file  Occupational History   Not on file  Tobacco Use   Smoking status: Never   Smokeless tobacco: Never  Vaping Use   Vaping status: Every Day  Substance and Sexual Activity   Alcohol use: Yes   Drug use: Never   Sexual activity: Yes  Other Topics Concern  Not on file  Social History Narrative   Not on file   Social Drivers of Health   Financial Resource Strain: High Risk (03/06/2021)   Overall Financial Resource Strain (CARDIA)    Difficulty of Paying Living Expenses: Hard  Food Insecurity: Food Insecurity Present (03/06/2021)   Hunger Vital Sign    Worried About Running Out of Food in the Last Year: Often true    Ran Out of Food in the Last Year: Sometimes true  Transportation Needs: No Transportation Needs (12/03/2020)   PRAPARE - Administrator, Civil Service (Medical): No    Lack of Transportation (Non-Medical): No  Physical Activity: Sufficiently Active  (04/16/2020)   Exercise Vital Sign    Days of Exercise per Week: 3 days    Minutes of Exercise per Session: 60 min  Stress: Stress Concern Present (03/06/2021)   [employer] of Occupational Health - Occupational Stress Questionnaire    Feeling of Stress : To some extent  Social Connections: Unknown (10/29/2021)   Received from [facility]   Social Network    Social Network: Not on file    Allergies:  Allergies  Allergen Reactions   Codeine    Iodine    Rocephin [Ceftriaxone Sodium In Dextrose]    Zofran [Ondansetron Hcl]     Metabolic Disorder Labs: Lab Results  Component Value Date   HGBA1C 4.8 10/22/2020   No results found for: "PROLACTIN" No results found for: "CHOL", "TRIG", "HDL", "CHOLHDL", "VLDL", "LDLCALC" Lab Results  Component Value Date   TSH 0.580 10/22/2020    Therapeutic Level Labs: No results found for: "LITHIUM" Lab Results  Component Value Date   VALPROATE 27 (L) 05/04/2020   No results found for: "CBMZ"  Current Medications: Current Outpatient Medications  Medication Sig Dispense Refill   ARIPiprazole (ABILIFY) 15 MG tablet Take 1 tablet (15 mg total) by mouth daily. 30 tablet 3   buPROPion (WELLBUTRIN XL) 150 MG 24 hr tablet Take 1 tablet (150 mg total) by mouth every morning. 30 tablet 3   lamoTRIgine (LAMICTAL) 150 MG tablet Take 1 tablet (150 mg total) by mouth daily. 30 tablet 3   Norgestimate-Ethinyl Estradiol Triphasic (ORTHO TRI-CYCLEN LO) 0.18/0.215/0.25 MG-25 MCG tab Take 1 tablet by mouth daily. 28 tablet 11   No current facility-administered medications for this visit.     Musculoskeletal: Strength & Muscle Tone: within normal limits and  telehealth visit Gait & Station: normal,  telehealth visit Patient leans: N/A  Psychiatric Specialty Exam: Review of Systems  There were no vitals taken for this visit.There is no height or weight on file to calculate BMI.  General Appearance: Well Groomed  Eye Contact:   Good  Speech:  Clear and Coherent and Normal Rate  Volume:  Normal  Mood:  Euthymic  Affect:  Appropriate and Congruent  Thought Process:  Coherent, Goal Directed and Linear  Orientation:  Full (Time, Place, and Person)  Thought Content: WDL and Logical   Suicidal Thoughts:  No  Homicidal Thoughts:  No  Memory:  Immediate;   Good Recent;   Good Remote;   Good  Judgement:  Good  Insight:  Good  Psychomotor Activity:  Normal  Concentration:  Concentration: Good and Attention Span: Good  Recall:  Good  Fund of Knowledge: Good  Language: Good  Akathisia:  None  Handed:  Right  AIMS (if indicated): Not done  Assets:  Communication Skills Desire for Improvement Financial Resources/Insurance Housing Intimacy Social Support  ADL's:  Intact  Cognition: WNL  Sleep:  Good   Screenings: AUDIT    Advertising copywriter from 11/28/2020 in [facility] Counselor from 04/16/2020 in [facility]  Alcohol Use Disorder Identification Test Final Score (AUDIT) 6 8      GAD-7    Flowsheet Row Video Visit from 07/10/2023 in [facility] Video Visit from 05/08/2023 in [facility] Video Visit from 02/26/2023 in [facility] Video Visit from 12/02/2022 in [facility] Video Visit from 09/15/2022 in [facility]  Total GAD-7 Score 0 2 1 13 12       PHQ2-9    Flowsheet Row Video Visit from 07/10/2023 in [facility] Video Visit from 05/08/2023 in [facility] Video Visit from 02/26/2023 in [facility] Video Visit from 12/02/2022 in [facility] Video Visit from 09/15/2022 in [facility]  PHQ-2 Total Score 0 0 0 5 4  PHQ-9 Total Score 0 2 0 19 12      Flowsheet Row ED  from 12/16/2021 in [facility] Emergency Department at [facility] Video Visit from 08/08/2021 in [facility] Video Visit from 05/21/2021 in [facility]  C-SSRS RISK CATEGORY No Risk Error: Question 6 not populated Error: Q3, 4, or 5 should not be populated when Q2 is No        Assessment and Plan: Patient reports that she is doing well on her current medication regimen.  Patient does not have recent routine labs.  Today provider ordered prolactin level, CBC, CMP, LFT, thyroid level, lamotrigine level, and HgbA1c.  No medication changes made today.  Patient agreeable to continue medications as prescribed.    1. Bipolar I disorder, most recent episode depressed (HCC)  Continue- ARIPiprazole (ABILIFY) 15 MG tablet; Take 1 tablet (15 mg total) by mouth daily.  Dispense: 30 tablet; Refill: 3 Continue- buPROPion (WELLBUTRIN XL) 150 MG 24 hr tablet; Take 1 tablet (150 mg total) by mouth every morning.  Dispense: 30 tablet; Refill: 3 Continue- lamoTRIgine (LAMICTAL) 150 MG tablet; Take 1 tablet (150 mg total) by mouth daily.  Dispense: 30 tablet; Refill: 3 - Lamotrigine level - CBC w/Diff/Platelet - Comprehensive Metabolic Panel (CMET) - Hepatic function panel - Prolactin - HgB A1c - Thyroid Panel With TSH   Follow-up in 2 months Follow-up with therapy  Shanna Cisco, NP 07/10/2023, 11:13 AM

## 2023-07-17 ENCOUNTER — Other Ambulatory Visit: Payer: Self-pay

## 2023-08-10 ENCOUNTER — Other Ambulatory Visit (HOSPITAL_COMMUNITY): Payer: Self-pay

## 2023-08-10 ENCOUNTER — Encounter (HOSPITAL_COMMUNITY): Payer: Self-pay

## 2023-09-02 ENCOUNTER — Other Ambulatory Visit (INDEPENDENT_AMBULATORY_CARE_PROVIDER_SITE_OTHER): Payer: No Payment, Other

## 2023-09-02 DIAGNOSIS — Z79899 Other long term (current) drug therapy: Secondary | ICD-10-CM | POA: Diagnosis not present

## 2023-09-02 DIAGNOSIS — F3130 Bipolar disorder, current episode depressed, mild or moderate severity, unspecified: Secondary | ICD-10-CM

## 2023-09-02 NOTE — Progress Notes (Signed)
 Patient tolerated  Labs well with no complaints .

## 2023-09-02 NOTE — Addendum Note (Signed)
 Addended by: Keturah Shavers on: 09/02/2023 11:29 AM   Modules accepted: Orders

## 2023-09-05 LAB — CBC WITH DIFFERENTIAL/PLATELET
Basophils Absolute: 0.1 10*3/uL (ref 0.0–0.2)
Basos: 0 %
EOS (ABSOLUTE): 0.3 10*3/uL (ref 0.0–0.4)
Eos: 2 %
Hematocrit: 47.2 % — ABNORMAL HIGH (ref 34.0–46.6)
Hemoglobin: 15.5 g/dL (ref 11.1–15.9)
Immature Grans (Abs): 0 10*3/uL (ref 0.0–0.1)
Immature Granulocytes: 0 %
Lymphocytes Absolute: 4.4 10*3/uL — ABNORMAL HIGH (ref 0.7–3.1)
Lymphs: 30 %
MCH: 27.7 pg (ref 26.6–33.0)
MCHC: 32.8 g/dL (ref 31.5–35.7)
MCV: 84 fL (ref 79–97)
Monocytes Absolute: 0.4 10*3/uL (ref 0.1–0.9)
Monocytes: 3 %
Neutrophils Absolute: 9.6 10*3/uL — ABNORMAL HIGH (ref 1.4–7.0)
Neutrophils: 65 %
Platelets: 249 10*3/uL (ref 150–450)
RBC: 5.59 x10E6/uL — ABNORMAL HIGH (ref 3.77–5.28)
RDW: 12.9 % (ref 11.7–15.4)
WBC: 14.7 10*3/uL — ABNORMAL HIGH (ref 3.4–10.8)

## 2023-09-05 LAB — THYROID PANEL WITH TSH

## 2023-09-05 LAB — COMPREHENSIVE METABOLIC PANEL

## 2023-09-05 LAB — LIPID PANEL

## 2023-09-05 LAB — HEPATIC FUNCTION PANEL

## 2023-09-05 LAB — HEMOGLOBIN A1C
Est. average glucose Bld gHb Est-mCnc: 94 mg/dL
Hgb A1c MFr Bld: 4.9 % (ref 4.8–5.6)

## 2023-09-05 LAB — LAMOTRIGINE LEVEL

## 2023-09-05 LAB — PROLACTIN

## 2023-09-11 NOTE — Progress Notes (Signed)
 Provider attempted to call the patient 3 times without success to inform her that all of her labs did not result/request that she have them redrawn.  Provider left a message for the patient.  Provider will discuss the above with patient at her next visit.

## 2023-09-18 ENCOUNTER — Telehealth (HOSPITAL_COMMUNITY): Payer: No Payment, Other | Admitting: Psychiatry

## 2023-09-24 ENCOUNTER — Encounter (HOSPITAL_COMMUNITY): Payer: Self-pay | Admitting: Psychiatry

## 2023-09-24 ENCOUNTER — Telehealth (HOSPITAL_COMMUNITY): Admitting: Psychiatry

## 2023-09-24 DIAGNOSIS — F3130 Bipolar disorder, current episode depressed, mild or moderate severity, unspecified: Secondary | ICD-10-CM

## 2023-09-24 MED ORDER — ARIPIPRAZOLE 15 MG PO TABS
15.0000 mg | ORAL_TABLET | Freq: Every day | ORAL | 3 refills | Status: DC
Start: 2023-09-24 — End: 2023-10-05

## 2023-09-24 MED ORDER — LAMOTRIGINE 150 MG PO TABS: 150.0000 mg | ORAL_TABLET | Freq: Every day | ORAL | 3 refills | Status: DC

## 2023-09-24 MED ORDER — BUPROPION HCL ER (XL) 150 MG PO TB24: 150.0000 mg | ORAL_TABLET | Freq: Every morning | ORAL | 3 refills | Status: DC

## 2023-09-24 NOTE — Progress Notes (Signed)
 BH MD/PA/NP OP Progress Note Virtual Visit via Video Note  I connected with Sara Pearson on 09/24/23 at  3:00 PM EDT by a video enabled telemedicine application and verified that I am speaking with the correct person using two identifiers.  Location: Patient: Home Provider: Clinic   I discussed the limitations of evaluation and management by telemedicine and the availability of in person appointments. The patient expressed understanding and agreed to proceed.  I provided 30 minutes of non-face-to-face time during this encounter.      09/24/2023 9:29 AM Sara Pearson  MRN:  [id number]  Chief Complaint: "Im stressed ay work"  HPI: 38 year old female seen today for followup psychiatric evaluation. She has a psychiatric history of Bipolar 1, anxiety, depression, and PTSD. She is currently managed on Abilify 15 mg daily, Lamictal 150 mg daily, and Wellbutrin XL 150 mg daily.  She reports that her medications are effective in managing her psychiatric conditions.     Today she was well-groomed, pleasant, cooperative, and engaged in conversation.  She informed Clinical research associate that she has been stressed at work. She works as a Environmental consultant at [employer] and notes that she can not keep up the demands of work. She notes that she crys daily because of stress. She notes that at times she has increased irritability at work. She note that when walks through the door at work she feels that the weight of the world is on her shoulder. She also notes that she has panic attacks at work. Patient notes that she sleeps 2-8 hours nightly. She endorses adequate appetite.    Patient notes that the above worsens her anxiety and depression. Today provider conducted a GAD-7 and patient scored a 21, her last visit she scored 0.  Provider also conducted PHQ-9 patient scored a 21, at her last visit she scored a 0.   Today she denies SI/HI/VAH, mania, paranoia.    Patient reports that she continues to have feet pain which she  quantifies as 6 out of 10.  She notes that she takes aleve to help manage his pain.    Patient got labs taken at her last visit but the quantity was not sufficient. Provider reordered Lamictal level, CMP, prolactin, thyroid level, and LFT. Patient request that medications not be adjusted as the above is situational.  She will continue all medications as prescribed.  Patient requested a letter detailing the above to submit to her employers.  Provider was agreeable.  No other concerns at this time.      Visit Diagnosis:    ICD-10-CM   1. Bipolar I disorder, most recent episode depressed (HCC)  F31.30 ARIPiprazole (ABILIFY) 15 MG tablet    lamoTRIgine (LAMICTAL) 150 MG tablet    buPROPion (WELLBUTRIN XL) 150 MG 24 hr tablet    Lamotrigine level    Prolactin    Lipid panel    Comprehensive Metabolic Panel (CMET)    Thyroid Panel With TSH    Thyroid Panel With TSH    Comprehensive Metabolic Panel (CMET)    Lipid panel    Prolactin    Lamotrigine level    HgB A1c    HgB A1c         Past Psychiatric History:  Bipolar 1, anxiety, depression, and PTSD.  Past Medical History:  Past Medical History:  Diagnosis Date   Bipolar 1 disorder (HCC)    Heel spur    Hypertension    Osteogenesis imperfecta    PCOS (polycystic  ovarian syndrome)    History reviewed. No pertinent surgical history.  Family Psychiatric History: Unknown    Family History: History reviewed. No pertinent family history.  Social History:  Social History   Socioeconomic History   Marital status: Married    Spouse name: Not on file   Number of children: Not on file   Years of education: Not on file   Highest education level: Not on file  Occupational History   Not on file  Tobacco Use   Smoking status: Never   Smokeless tobacco: Never  Vaping Use   Vaping status: Every Day  Substance and Sexual Activity   Alcohol use: Yes   Drug use: Never   Sexual activity: Yes  Other Topics Concern   Not on  file  Social History Narrative   Not on file   Social Drivers of Health   Financial Resource Strain: High Risk (03/06/2021)   Overall Financial Resource Strain (CARDIA)    Difficulty of Paying Living Expenses: Hard  Food Insecurity: Food Insecurity Present (03/06/2021)   Hunger Vital Sign    Worried About Running Out of Food in the Last Year: Often true    Ran Out of Food in the Last Year: Sometimes true  Transportation Needs: No Transportation Needs (12/03/2020)   PRAPARE - Administrator, Civil Service (Medical): No    Lack of Transportation (Non-Medical): No  Physical Activity: Sufficiently Active (04/16/2020)   Exercise Vital Sign    Days of Exercise per Week: 3 days    Minutes of Exercise per Session: 60 min  Stress: Stress Concern Present (03/06/2021)   [employer] of Occupational Health - Occupational Stress Questionnaire    Feeling of Stress : To some extent  Social Connections: Unknown (10/29/2021)   [facility] from [facility]   Social Network    Social Network: Not on file    Allergies:  Allergies  Allergen Reactions   Codeine    Iodine    Rocephin [Ceftriaxone Sodium In Dextrose]    Zofran [Ondansetron Hcl]     Metabolic Disorder Labs: Lab Results  Component Value Date   HGBA1C 4.9 09/02/2023   Lab Results  Component Value Date   PROLACTIN CANCELED 09/02/2023   Lab Results  Component Value Date   CHOL CANCELED 09/02/2023   TRIG CANCELED 09/02/2023   HDL CANCELED 09/02/2023   CHOLHDL CANCELED 09/02/2023   LDLCALC CANCELED 09/02/2023   Lab Results  Component Value Date   TSH CANCELED 09/02/2023   TSH 0.580 10/22/2020    Therapeutic Level Labs: No results found for: "LITHIUM" Lab Results  Component Value Date   VALPROATE 27 (L) 05/04/2020   No results found for: "CBMZ"  Current Medications: Current Outpatient Medications  Medication Sig Dispense Refill   ARIPiprazole (ABILIFY) 15 MG tablet Take 1 tablet (15 mg  total) by mouth daily. 30 tablet 3   buPROPion (WELLBUTRIN XL) 150 MG 24 hr tablet Take 1 tablet (150 mg total) by mouth every morning. 30 tablet 3   lamoTRIgine (LAMICTAL) 150 MG tablet Take 1 tablet (150 mg total) by mouth daily. 30 tablet 3   Norgestimate-Ethinyl Estradiol Triphasic (ORTHO TRI-CYCLEN LO) 0.18/0.215/0.25 MG-25 MCG tab Take 1 tablet by mouth daily. 28 tablet 11   No current facility-administered medications for this visit.     Musculoskeletal: Strength & Muscle Tone: within normal limits and  telehealth visit Gait & Station: normal,  telehealth visit Patient leans: N/A  Psychiatric Specialty Exam: Review  of Systems  There were no vitals taken for this visit.There is no height or weight on file to calculate BMI.  General Appearance: Well Groomed  Eye Contact:  Good  Speech:  Clear and Coherent and Normal Rate  Volume:  Normal  Mood:  Anxious and Depressed  Affect:  Appropriate and Congruent  Thought Process:  Coherent, Goal Directed and Linear  Orientation:  Full (Time, Place, and Person)  Thought Content: WDL and Logical   Suicidal Thoughts:  No  Homicidal Thoughts:  No  Memory:  Immediate;   Good Recent;   Good Remote;   Good  Judgement:  Good  Insight:  Good  Psychomotor Activity:  Normal  Concentration:  Concentration: Good and Attention Span: Good  Recall:  Good  Fund of Knowledge: Good  Language: Good  Akathisia:  None  Handed:  Right  AIMS (if indicated): Not done  Assets:  Communication Skills Desire for Improvement Financial Resources/Insurance Housing Intimacy Social Support  ADL's:  Intact  Cognition: WNL  Sleep:  Fair   Screenings: AUDIT    Advertising copywriter from 11/28/2020 in [facility] Counselor from 04/16/2020 in [facility]  Alcohol Use Disorder Identification Test Final Score (AUDIT) 6 8      GAD-7    Flowsheet Row Video Visit from 09/24/2023 in [facility] Video Visit from 07/10/2023 in [facility] Video Visit from 05/08/2023 in [facility] Video Visit from 02/26/2023 in [facility] Video Visit from 12/02/2022 in [facility]  Total GAD-7 Score 21 0 2 1 13       PHQ2-9    Flowsheet Row Video Visit from 09/24/2023 in [facility] Video Visit from 07/10/2023 in [facility] Video Visit from 05/08/2023 in [facility] Video Visit from 02/26/2023 in [facility] Video Visit from 12/02/2022 in [facility]  PHQ-2 Total Score 6 0 0 0 5  PHQ-9 Total Score 21 0 2 0 19      Flowsheet Row Video Visit from 09/24/2023 in [facility] ED from 12/16/2021 in [facility] Video Visit from 08/08/2021 in [facility]  C-SSRS RISK CATEGORY Error: Q7 should not be populated when Q6 is No No Risk Error: Question 6 not populated        Assessment and Plan: Patient reports that she has increased anxiety depression due to work stressors. Patient got labs taken at her last visit but the quantity was not sufficient. Provider reordered Lamictal level, CMP, prolactin, thyroid level, and LFT. Patient request that medications not be adjusted as the above is situational.  She will continue all medications as prescribed.  Patient requested a letter detailing the above to submit to her employers.  Provider was agreeable.    1. Bipolar I disorder, most recent episode depressed (HCC)  Continue- ARIPiprazole (ABILIFY) 15 MG tablet; Take 1 tablet (15 mg total) by mouth daily.  Dispense: 30 tablet; Refill: 3 Continue- lamoTRIgine (LAMICTAL) 150 MG tablet; Take 1 tablet (150 mg total) by mouth daily.  Dispense: 30  tablet; Refill: 3 Continue- buPROPion (WELLBUTRIN XL) 150 MG 24 hr tablet; Take 1 tablet (150 mg total) by mouth every morning.  Dispense: 30 tablet; Refill: 3 - Lamotrigine level; Future - Prolactin; Future - Lipid panel;  Future - Comprehensive Metabolic Panel (CMET); Future - Thyroid Panel With TSH; Future - Thyroid Panel With TSH - Comprehensive Metabolic Panel (CMET) - Lipid panel - Prolactin - Lamotrigine level - HgB A1c; Future - HgB A1c   Follow-up in 2 months Follow-up with therapy  Shanna Cisco, NP 09/24/2023, 9:29 AM

## 2023-09-30 ENCOUNTER — Telehealth (HOSPITAL_COMMUNITY): Payer: Self-pay

## 2023-09-30 NOTE — Telephone Encounter (Signed)
 Pt states that she is feeling very sluggish, tired, and not motivated to do anything. Talking with pt she believes that she needs a medication adjustment. Pt would like a phone call regarding symptoms if possible.     JNL

## 2023-10-05 ENCOUNTER — Other Ambulatory Visit (HOSPITAL_COMMUNITY): Payer: Self-pay | Admitting: Psychiatry

## 2023-10-05 DIAGNOSIS — F3130 Bipolar disorder, current episode depressed, mild or moderate severity, unspecified: Secondary | ICD-10-CM

## 2023-10-05 MED ORDER — ARIPIPRAZOLE 20 MG PO TABS: 20.0000 mg | ORAL_TABLET | Freq: Every day | ORAL | 3 refills | Status: DC

## 2023-10-05 MED ORDER — GABAPENTIN 100 MG PO CAPS: 100.0000 mg | ORAL_CAPSULE | Freq: Three times a day (TID) | ORAL | 3 refills | Status: DC

## 2023-10-05 NOTE — Telephone Encounter (Signed)
 Patient reports that recently she has been sluggish.  She notes that on 1 occasion she slept 18 hours.  She also notes that she has been irritable, distracted, having racing thoughts, and fluctuations in mood.  Patient informed Clinical research associate that she impulsively spending money on household things that she does not need.  She also notes that recently she has been driving fast.  Patient also notes that she has been experiencing paranoia specifically at work.  She describes feeling that the world will end.  She also notes that she has been increasingly anxious.  Today Abilify  15 mg increased to 20 mg to help manage mood and paranoia.  Gabapentin  100 mg 3 times daily started to help manage mood and anxiety.  She will continue other medications as prescribed.

## 2023-11-26 ENCOUNTER — Telehealth (HOSPITAL_COMMUNITY): Admitting: Psychiatry

## 2023-11-26 ENCOUNTER — Encounter (HOSPITAL_COMMUNITY): Payer: Self-pay | Admitting: Psychiatry

## 2023-11-26 DIAGNOSIS — F3130 Bipolar disorder, current episode depressed, mild or moderate severity, unspecified: Secondary | ICD-10-CM | POA: Diagnosis not present

## 2023-11-26 MED ORDER — BUPROPION HCL ER (XL) 150 MG PO TB24: 150.0000 mg | ORAL_TABLET | Freq: Every morning | ORAL | 3 refills | Status: DC

## 2023-11-26 MED ORDER — LAMOTRIGINE 150 MG PO TABS: 150.0000 mg | ORAL_TABLET | Freq: Every day | ORAL | 3 refills | Status: DC

## 2023-11-26 MED ORDER — GABAPENTIN 100 MG PO CAPS: 100.0000 mg | ORAL_CAPSULE | Freq: Three times a day (TID) | ORAL | 3 refills | Status: DC

## 2023-11-26 MED ORDER — ARIPIPRAZOLE 20 MG PO TABS: 20.0000 mg | ORAL_TABLET | Freq: Every day | ORAL | 3 refills | Status: DC

## 2023-11-26 NOTE — Progress Notes (Signed)
 BH MD/PA/NP OP Progress Note Virtual Visit via Video Note  I connected with Sara Pearson on 11/26/23 at  1:30 PM EDT by a video enabled telemedicine application and verified that I am speaking with the correct person using two identifiers.  Location: Patient: Home Provider: Clinic   I discussed the limitations of evaluation and management by telemedicine and the availability of in person appointments. The patient expressed understanding and agreed to proceed.  I provided 30 minutes of non-face-to-face time during this encounter.      11/26/2023 9:45 AM Sara Pearson  MRN:  [id number]  Chief Complaint: Everything is well  HPI: 38 year old female seen today for followup psychiatric evaluation. She has a psychiatric history of Bipolar 1, anxiety, depression, and PTSD. She is currently managed on Abilify  15 mg daily, Lamictal  150 mg daily, gabapentin  100 mg three times daily, and Wellbutrin  XL 150 mg daily.  She reports that her medications are effective in managing her psychiatric conditions.     Today she was well-groomed, pleasant, cooperative, and engaged in conversation.  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 Patient notes that she now feels appreciated and enjoys work. She also notes that since starting gabapentin  her mood is more stable and her anxiety/depression has improved.  Today provider conducted a GAD-7 and patient scored a 1, her last visit she scored 21.  Provider also conducted PHQ-9 patient scored a 1, at her last visit she scored a 21.   Today she denies SI/HI/VAH, mania, paranoia.  She endorses adequate sleep and appetite.   Patient reports that she continues to have feet pain which she quantifies as 6 out of 10.  She notes that she takes aleve to help manage his pain.     Provider requested patient have labs drawn that was ordered at her last visit. She endorsed understanding and agreed. No medication  changes made today. Patient agreeable to continue medications as prescribed.   No other concerns at this time.      Visit Diagnosis:    ICD-10-CM   1. Bipolar I disorder, most recent episode depressed (HCC)  F31.30 ARIPiprazole  (ABILIFY ) 20 MG tablet    buPROPion  (WELLBUTRIN  XL) 150 MG 24 hr tablet    lamoTRIgine  (LAMICTAL ) 150 MG tablet    gabapentin  (NEURONTIN ) 100 MG capsule          Past Psychiatric History:  Bipolar 1, anxiety, depression, and PTSD.  Past Medical History:  Past Medical History:  Diagnosis Date   Bipolar 1 disorder (HCC)    Heel spur    Hypertension    Osteogenesis imperfecta    PCOS (polycystic ovarian syndrome)    History reviewed. No pertinent surgical history.  Family Psychiatric History: Unknown    Family History: History reviewed. No pertinent family history.  Social History:  Social History   Socioeconomic History   Marital status: Married    Spouse name: Not on file   Number of children: Not on file   Years of education: Not on file   Highest education level: Not on file  Occupational History   Not on file  Tobacco Use   Smoking status: Never   Smokeless tobacco: Never  Vaping Use   Vaping status: Every Day  Substance and Sexual Activity   Alcohol use: Yes   Drug use: Never   Sexual activity: Yes  Other Topics Concern   Not on file  Social History Narrative  Not on file   Social Drivers of Health   Financial Resource Strain: High Risk (03/06/2021)   Overall Financial Resource Strain (CARDIA)    Difficulty of Paying Living Expenses: Hard  Food Insecurity: Food Insecurity Present (03/06/2021)   Hunger Vital Sign    Worried About Running Out of Food in the Last Year: Often true    Ran Out of Food in the Last Year: Sometimes true  Transportation Needs: No Transportation Needs (12/03/2020)   PRAPARE - Administrator, Civil Service (Medical): No    Lack of Transportation (Non-Medical): No  Physical Activity:  Sufficiently Active (04/16/2020)   Exercise Vital Sign    Days of Exercise per Week: 3 days    Minutes of Exercise per Session: 60 min  Stress: Stress Concern Present (03/06/2021)   [employer] of Occupational Health - Occupational Stress Questionnaire    Feeling of Stress : To some extent  Social Connections: Unknown (10/29/2021)   Received from [facility]   Social Network    Social Network: Not on file    Allergies:  Allergies  Allergen Reactions   Codeine    Iodine    Rocephin [Ceftriaxone Sodium In Dextrose]    Zofran [Ondansetron Hcl]     Metabolic Disorder Labs: Lab Results  Component Value Date   HGBA1C 4.9 09/02/2023   Lab Results  Component Value Date   PROLACTIN CANCELED 09/02/2023   Lab Results  Component Value Date   CHOL CANCELED 09/02/2023   TRIG CANCELED 09/02/2023   HDL CANCELED 09/02/2023   CHOLHDL CANCELED 09/02/2023   LDLCALC CANCELED 09/02/2023   Lab Results  Component Value Date   TSH CANCELED 09/02/2023   TSH 0.580 10/22/2020    Therapeutic Level Labs: No results found for: LITHIUM Lab Results  Component Value Date   VALPROATE 27 (L) 05/04/2020   No results found for: CBMZ  Current Medications: Current Outpatient Medications  Medication Sig Dispense Refill   ARIPiprazole  (ABILIFY ) 20 MG tablet Take 1 tablet (20 mg total) by mouth daily. 30 tablet 3   buPROPion  (WELLBUTRIN  XL) 150 MG 24 hr tablet Take 1 tablet (150 mg total) by mouth every morning. 30 tablet 3   gabapentin  (NEURONTIN ) 100 MG capsule Take 1 capsule (100 mg total) by mouth 3 (three) times daily. 90 capsule 3   lamoTRIgine  (LAMICTAL ) 150 MG tablet Take 1 tablet (150 mg total) by mouth daily. 30 tablet 3   Norgestimate -Ethinyl Estradiol Triphasic (ORTHO TRI-CYCLEN LO) 0.18/0.215/0.25 MG-25 MCG tab Take 1 tablet by mouth daily. 28 tablet 11   No current facility-administered medications for this visit.     Musculoskeletal: Strength & Muscle Tone: within  normal limits and  telehealth visit Gait & Station: normal,  telehealth visit Patient leans: N/A  Psychiatric Specialty Exam: Review of Systems  There were no vitals taken for this visit.There is no height or weight on file to calculate BMI.  General Appearance: Well Groomed  Eye Contact:  Good  Speech:  Clear and Coherent and Normal Rate  Volume:  Normal  Mood:  Euthymic  Affect:  Appropriate and Congruent  Thought Process:  Coherent, Goal Directed and Linear  Orientation:  Full (Time, Place, and Person)  Thought Content: WDL and Logical   Suicidal Thoughts:  No  Homicidal Thoughts:  No  Memory:  Immediate;   Good Recent;   Good Remote;   Good  Judgement:  Good  Insight:  Good  Psychomotor Activity:  Normal  Concentration:  Concentration: Good and Attention Span: Good  Recall:  Good  Fund of Knowledge: Good  Language: Good  Akathisia:  None  Handed:  Right  AIMS (if indicated): Not done  Assets:  Communication Skills Desire for Improvement Financial Resources/Insurance Housing Intimacy Social Support  ADL's:  Intact  Cognition: WNL  Sleep:  Good   Screenings: AUDIT    Advertising copywriter from 11/28/2020 in [facility] Counselor from 04/16/2020 in [facility]  Alcohol Use Disorder Identification Test Final Score (AUDIT) 6 8   GAD-7    Flowsheet Row Video Visit from 11/26/2023 in [facility] Video Visit from 09/24/2023 in [facility] Video Visit from 07/10/2023 in [facility] Video Visit from 05/08/2023 in [facility] Video Visit from 02/26/2023 in [facility]  Total GAD-7 Score 1 21 0 2 1   PHQ2-9    Flowsheet Row Video Visit from 11/26/2023 in [facility] Video Visit from 09/24/2023 in [facility] Video Visit  from 07/10/2023 in [facility] Video Visit from 05/08/2023 in [facility] Video Visit from 02/26/2023 in [facility]  PHQ-2 Total Score 0 6 0 0 0  PHQ-9 Total Score 1 21 0 2 0   Flowsheet Row Video Visit from 09/24/2023 in [facility] ED from 12/16/2021 in [facility] Department at [facility] Video Visit from 08/08/2021 in [facility]  C-SSRS RISK CATEGORY Error: Q7 should not be populated when Q6 is No No Risk Error: Question 6 not populated     Assessment and Plan: Patient reports her anxiety, depression, sleep and mood has improved since her last visit. Provider requested patient have labs drawn that was ordered at her last visit. She endorsed understanding and agreed. No medication changes made today. Patient agreeable to continue medications as prescribed.  1. Bipolar I disorder, most recent episode depressed (HCC)  Continue- ARIPiprazole  (ABILIFY ) 20 MG tablet; Take 1 tablet (20 mg total) by mouth daily.  Dispense: 30 tablet; Refill: 3 Continue- buPROPion  (WELLBUTRIN  XL) 150 MG 24 hr tablet; Take 1 tablet (150 mg total) by mouth every morning.  Dispense: 30 tablet; Refill: 3 Continue- lamoTRIgine  (LAMICTAL ) 150 MG tablet; Take 1 tablet (150 mg total) by mouth daily.  Dispense: 30 tablet; Refill: 3 Continue- gabapentin  (NEURONTIN ) 100 MG capsule; Take 1 capsule (100 mg total) by mouth 3 (three) times daily.  Dispense: 90 capsule; Refill: 3     Follow-up in 3 months Follow-up with therapy  Arlyne Bering, NP 11/26/2023, 9:45 AM

## 2024-02-16 ENCOUNTER — Telehealth (HOSPITAL_COMMUNITY): Admitting: Physician Assistant

## 2024-02-23 ENCOUNTER — Telehealth (HOSPITAL_COMMUNITY): Admitting: Physician Assistant

## 2024-03-11 ENCOUNTER — Telehealth (HOSPITAL_COMMUNITY): Admitting: Psychiatry

## 2024-03-11 ENCOUNTER — Encounter (HOSPITAL_COMMUNITY): Payer: Self-pay

## 2024-03-16 ENCOUNTER — Telehealth (INDEPENDENT_AMBULATORY_CARE_PROVIDER_SITE_OTHER): Admitting: Psychiatry

## 2024-03-16 ENCOUNTER — Encounter (HOSPITAL_COMMUNITY): Payer: Self-pay | Admitting: Psychiatry

## 2024-03-16 DIAGNOSIS — N920 Excessive and frequent menstruation with regular cycle: Secondary | ICD-10-CM

## 2024-03-16 DIAGNOSIS — F3130 Bipolar disorder, current episode depressed, mild or moderate severity, unspecified: Secondary | ICD-10-CM

## 2024-03-16 MED ORDER — LAMOTRIGINE 150 MG PO TABS: 150.0000 mg | ORAL_TABLET | Freq: Every day | ORAL | 3 refills | Status: DC

## 2024-03-16 NOTE — Progress Notes (Signed)
 BH MD/PA/NP OP Progress Note Virtual Visit via Video Note  I connected with Sara Pearson on 03/16/24 at  1:30 PM EDT by a video enabled telemedicine application and verified that I am speaking with the correct person using two identifiers.  Location: Patient: Home Provider: Clinic   I discussed the limitations of evaluation and management by telemedicine and the availability of in person appointments. The patient expressed understanding and agreed to proceed.  I provided 30 minutes of non-face-to-face time during this encounter.      03/16/2024 10:04 AM Sara Pearson  MRN:  [id number]  Chief Complaint: I am only on Lamictal  and feel stable.  HPI: 38 year old female seen today for followup psychiatric evaluation. She has a psychiatric history of Bipolar 1, anxiety, depression, and PTSD. She is currently managed on Abilify  15 mg daily, Lamictal  150 mg daily, gabapentin  100 mg three times daily, and Wellbutrin  XL 150 mg daily.  She reports that she has only been taking Lamictal  as she has not been able to afford her other medications.  She does report that Lamictal  is effective in managing her psychiatric conditions.      Today she was well-groomed, pleasant, cooperative, and engaged in conversation.  She informed Clinical research associate that she feels mentally stable on Lamictal .  She denies symptoms of mania.  Patient also notes that she has not been experiencing paranoia.  Mentally she reports that she is in a good place and reports that she has minimal anxiety and depression.  Today provider conducted a GAD-7 and patient scored a 6.  Provider also conducted PHQ-9 and patient scored a 3.  She endorsed adequate sleep and appetite.  Today she denies SI/HI/AVH.    Patient informed writer that she has bone spurs and plantar fasciitis.  She has been seen by podiatry.  She quantifies her pain today is 5 out of 10.  She inform her that Aleve and Tylenol  are somewhat effective in managing her pain.  Recently  patient notes that she has been having increased menstrual bleeding.  She informed Clinical research associate that she saturated 4 pads in an 8-hour window.  She informed Clinical research associate that she has a family history of ovarian cancer and fibroids.  [facility] [place] [facility]N care.  At this time gabapentin , Abilify , and Wellbutrin  not restarted.  Patient will continue Lamictal  as prescribed.  No other concerns at this time.      Visit Diagnosis:  No diagnosis found.       Past Psychiatric History:  Bipolar 1, anxiety, depression, and PTSD.  Past Medical History:  Past Medical History:  Diagnosis Date   Bipolar 1 disorder (HCC)    Heel spur    Hypertension    Osteogenesis imperfecta    PCOS (polycystic ovarian syndrome)    No past surgical history on file.  Family Psychiatric History: Unknown    Family History: No family history on file.  Social History:  Social History   Socioeconomic History   Marital status: Married    Spouse name: Not on file   Number of children: Not on file   Years of education: Not on file   Highest education level: Not on file  Occupational History   Not on file  Tobacco Use   Smoking status: Never   Smokeless tobacco: Never  Vaping Use   Vaping status: Every Day  Substance and Sexual Activity   Alcohol use: Yes   Drug use: Never  Sexual activity: Yes  Other Topics Concern   Not on file  Social History Narrative   Not on file   Social Drivers of Health   Financial Resource Strain: High Risk (03/06/2021)   Overall Financial Resource Strain (CARDIA)    Difficulty of Paying Living Expenses: Hard  Food Insecurity: Food Insecurity Present (03/06/2021)   Hunger Vital Sign    Worried About Running Out of Food in the Last Year: Often true    Ran Out of Food in the Last Year: Sometimes true  Transportation Needs: No Transportation Needs (12/03/2020)   PRAPARE - Administrator, Civil Service (Medical): No    Lack  of Transportation (Non-Medical): No  Physical Activity: Sufficiently Active (04/16/2020)   Exercise Vital Sign    Days of Exercise per Week: 3 days    Minutes of Exercise per Session: 60 min  Stress: Stress Concern Present (03/06/2021)   [employer] of Occupational Health - Occupational Stress Questionnaire    Feeling of Stress : To some extent  Social Connections: Unknown (10/29/2021)   [facility] from [facility]   Social Network    Social Network: Not on file    Allergies:  Allergies  Allergen Reactions   Codeine    Iodine    Rocephin [Ceftriaxone Sodium In Dextrose]    Zofran [Ondansetron Hcl]     Metabolic Disorder Labs: Lab Results  Component Value Date   HGBA1C 4.9 09/02/2023   Lab Results  Component Value Date   PROLACTIN CANCELED 09/02/2023   Lab Results  Component Value Date   CHOL CANCELED 09/02/2023   TRIG CANCELED 09/02/2023   HDL CANCELED 09/02/2023   CHOLHDL CANCELED 09/02/2023   LDLCALC CANCELED 09/02/2023   Lab Results  Component Value Date   TSH CANCELED 09/02/2023   TSH 0.580 10/22/2020    Therapeutic Level Labs: No results found for: LITHIUM Lab Results  Component Value Date   VALPROATE 27 (L) 05/04/2020   No results found for: CBMZ  Current Medications: Current Outpatient Medications  Medication Sig Dispense Refill   ARIPiprazole  (ABILIFY ) 20 MG tablet Take 1 tablet (20 mg total) by mouth daily. 30 tablet 3   buPROPion  (WELLBUTRIN  XL) 150 MG 24 hr tablet Take 1 tablet (150 mg total) by mouth every morning. 30 tablet 3   gabapentin  (NEURONTIN ) 100 MG capsule Take 1 capsule (100 mg total) by mouth 3 (three) times daily. 90 capsule 3   lamoTRIgine  (LAMICTAL ) 150 MG tablet Take 1 tablet (150 mg total) by mouth daily. 30 tablet 3   Norgestimate -Ethinyl Estradiol Triphasic (ORTHO TRI-CYCLEN LO) 0.18/0.215/0.25 MG-25 MCG tab Take 1 tablet by mouth daily. 28 tablet 11   No current facility-administered medications for this visit.      Musculoskeletal: Strength & Muscle Tone: within normal limits and  telehealth visit Gait & Station: normal,  telehealth visit Patient leans: N/A  Psychiatric Specialty Exam: Review of Systems  There were no vitals taken for this visit.There is no height or weight on file to calculate BMI.  General Appearance: Well Groomed  Eye Contact:  Good  Speech:  Clear and Coherent and Normal Rate  Volume:  Normal  Mood:  Euthymic  Affect:  Appropriate and Congruent  Thought Process:  Coherent, Goal Directed and Linear  Orientation:  Full (Time, Place, and Person)  Thought Content: WDL and Logical   Suicidal Thoughts:  No  Homicidal Thoughts:  No  Memory:  Immediate;   Good Recent;   Good Remote;  Good  Judgement:  Good  Insight:  Good  Psychomotor Activity:  Normal  Concentration:  Concentration: Good and Attention Span: Good  Recall:  Good  Fund of Knowledge: Good  Language: Good  Akathisia:  None  Handed:  Right  AIMS (if indicated): Not done  Assets:  Communication Skills Desire for Improvement Financial Resources/Insurance Housing Intimacy Social Support  ADL's:  Intact  Cognition: WNL  Sleep:  Good   Screenings: AUDIT    Advertising copywriter from 11/28/2020 in [facility] Counselor from 04/16/2020 in [facility]  Alcohol Use Disorder Identification Test Final Score (AUDIT) 6 8   GAD-7    Flowsheet Row Video Visit from 11/26/2023 in [facility] Video Visit from 09/24/2023 in [facility] Video Visit from 07/10/2023 in [facility] Video Visit from 05/08/2023 in [facility] Video Visit from 02/26/2023 in [facility]  Total GAD-7 Score 1 21 0 2 1   PHQ2-9    Flowsheet Row Video Visit from 11/26/2023 in [facility] Video Visit from 09/24/2023  in [facility] Video Visit from 07/10/2023 in [facility] Video Visit from 05/08/2023 in [facility] Video Visit from 02/26/2023 in [facility]  PHQ-2 Total Score 0 6 0 0 0  PHQ-9 Total Score 1 21 0 2 0   Flowsheet Row Video Visit from 09/24/2023 in [facility] ED from 12/16/2021 in [facility] Emergency Department at [facility] Video Visit from 08/08/2021 in [facility]  C-SSRS RISK CATEGORY Error: Q7 should not be populated when Q6 is No No Risk Error: Question 6 not populated     Assessment and Plan: Patient reports her anxiety, depression, sleep and mood has improved while being on just lamictal . Recently patient notes that she has been having increased menstrual bleeding.  She informed Clinical research associate that she saturated 4 pads in an 8-hour window.  She informed Clinical research associate that she has a family history of ovarian cancer and fibroids.  [facility] [place] for [facility]/GYN care.At this time gabapentin , Abilify , and Wellbutrin  not restarted.  Patient will continue Lamictal  as prescribed.  1. Bipolar I disorder, most recent episode depressed (HCC)  Continue- lamoTRIgine  (LAMICTAL ) 150 MG tablet; Take 1 tablet (150 mg total) by mouth daily.  Dispense: 30 tablet; Refill: 3  2. Menorrhagia with regular cycle (Primary)  - Ambulatory referral to Obstetrics / Gynecology   Follow-up in 2 months Follow-up with therapy  Zane FORBES Bach, NP 03/16/2024, 10:04 AM

## 2024-05-18 ENCOUNTER — Encounter (HOSPITAL_COMMUNITY): Payer: Self-pay | Admitting: Psychiatry

## 2024-05-18 ENCOUNTER — Telehealth (INDEPENDENT_AMBULATORY_CARE_PROVIDER_SITE_OTHER): Admitting: Psychiatry

## 2024-05-18 DIAGNOSIS — F3130 Bipolar disorder, current episode depressed, mild or moderate severity, unspecified: Secondary | ICD-10-CM | POA: Diagnosis not present

## 2024-05-18 MED ORDER — ARIPIPRAZOLE 20 MG PO TABS: 20.0000 mg | ORAL_TABLET | Freq: Every day | ORAL | 3 refills | Status: DC

## 2024-05-18 MED ORDER — GABAPENTIN 300 MG PO CAPS: 300.0000 mg | ORAL_CAPSULE | Freq: Three times a day (TID) | ORAL | 3 refills | Status: DC

## 2024-05-18 MED ORDER — LAMOTRIGINE 150 MG PO TABS: 150.0000 mg | ORAL_TABLET | Freq: Every day | ORAL | 3 refills | Status: DC

## 2024-05-18 NOTE — Progress Notes (Signed)
 BH MD/PA/NP OP Progress Note Virtual Visit via Video Note  I connected with Sara Pearson on 05/18/24 at  9:00 AM EST by a video enabled telemedicine application and verified that I am speaking with the correct person using two identifiers.  Location: Patient: Home Provider: Clinic   I discussed the limitations of evaluation and management by telemedicine and the availability of in person appointments. The patient expressed understanding and agreed to proceed.  I provided 30 minutes of non-face-to-face time during this encounter.      05/18/2024 9:04 AM Ladon Rasher Rodrigue  MRN:  [id number]  Chief Complaint: The holiday crowds make me anxious.  HPI: 38 year old female seen today for followup psychiatric evaluation. She has a psychiatric history of Bipolar 1, anxiety, depression, and PTSD. She is currently managed on Abilify  20 mg daily, Lamictal  150 mg daily, and gabapentin  100 mg three times daily..  She reports that her medications are her somewhat effective in managing her psychiatric conditions.      Today she was well-groomed, pleasant, cooperative, and engaged in conversation.  She informed clinical research associate that the holiday crowds make her anxious.  She requested to have gabapentin  increased.  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  Today provider conducted a GAD-7 and patient scored an 11.  Provider also conducted PHQ-9 the patient scored a 2.  She endorses adequate sleep and appetite. Today she denies SI/HI/AVH.    Patient reports that she continues to have menorrhagia.  She does note that she has an upcoming visit with her OB/GYN.  Today gabapentin  100 mg 3 times daily increased to 300 mg 3 times daily to help manage anxiety.  She will continue her other medications as prescribed.  No other concerns at this time.      Visit Diagnosis:  No diagnosis found.       Past Psychiatric History:  Bipolar 1, anxiety, depression, and  PTSD.  Past Medical History:  Past Medical History:  Diagnosis Date   Bipolar 1 disorder (HCC)    Heel spur    Hypertension    Osteogenesis imperfecta    PCOS (polycystic ovarian syndrome)    No past surgical history on file.  Family Psychiatric History: Unknown    Family History: No family history on file.  Social History:  Social History   Socioeconomic History   Marital status: Married    Spouse name: Not on file   Number of children: Not on file   Years of education: Not on file   Highest education level: Not on file  Occupational History   Not on file  Tobacco Use   Smoking status: Never   Smokeless tobacco: Never  Vaping Use   Vaping status: Every Day  Substance and Sexual Activity   Alcohol use: Yes   Drug use: Never   Sexual activity: Yes  Other Topics Concern   Not on file  Social History Narrative   Not on file   Social Drivers of Health   Financial Resource Strain: High Risk (03/06/2021)   Overall Financial Resource Strain (CARDIA)    Difficulty of Paying Living Expenses: Hard  Food Insecurity: Food Insecurity Present (03/06/2021)   Hunger Vital Sign    Worried About Running Out of Food in the Last Year: Often true    Ran Out of Food in the Last Year: Sometimes true  Transportation Needs: No Transportation Needs (12/03/2020)   PRAPARE - Transportation  Lack of Transportation (Medical): No    Lack of Transportation (Non-Medical): No  Physical Activity: Sufficiently Active (04/16/2020)   Exercise Vital Sign    Days of Exercise per Week: 3 days    Minutes of Exercise per Session: 60 min  Stress: Stress Concern Present (03/06/2021)   [employer] of Occupational Health - Occupational Stress Questionnaire    Feeling of Stress : To some extent  Social Connections: Unknown (10/29/2021)   Received from [facility]   Social Network    Social Network: Not on file    Allergies:  Allergies  Allergen Reactions   Codeine    Iodine    Rocephin  [Ceftriaxone Sodium In Dextrose]    Zofran [Ondansetron Hcl]     Metabolic Disorder Labs: Lab Results  Component Value Date   HGBA1C 4.9 09/02/2023   Lab Results  Component Value Date   PROLACTIN CANCELED 09/02/2023   Lab Results  Component Value Date   CHOL CANCELED 09/02/2023   TRIG CANCELED 09/02/2023   HDL CANCELED 09/02/2023   CHOLHDL CANCELED 09/02/2023   LDLCALC CANCELED 09/02/2023   Lab Results  Component Value Date   TSH CANCELED 09/02/2023   TSH 0.580 10/22/2020    Therapeutic Level Labs: No results found for: LITHIUM Lab Results  Component Value Date   VALPROATE 27 (L) 05/04/2020   No results found for: CBMZ  Current Medications: Current Outpatient Medications  Medication Sig Dispense Refill   ARIPiprazole  (ABILIFY ) 20 MG tablet Take 1 tablet (20 mg total) by mouth daily. 30 tablet 3   gabapentin  (NEURONTIN ) 100 MG capsule Take 1 capsule (100 mg total) by mouth 3 (three) times daily. 90 capsule 3   lamoTRIgine  (LAMICTAL ) 150 MG tablet Take 1 tablet (150 mg total) by mouth daily. 30 tablet 3   Norgestimate -Ethinyl Estradiol Triphasic (ORTHO TRI-CYCLEN LO) 0.18/0.215/0.25 MG-25 MCG tab Take 1 tablet by mouth daily. 28 tablet 11   No current facility-administered medications for this visit.     Musculoskeletal: Strength & Muscle Tone: within normal limits and  telehealth visit Gait & Station: normal,  telehealth visit Patient leans: N/A  Psychiatric Specialty Exam: Review of Systems  There were no vitals taken for this visit.There is no height or weight on file to calculate BMI.  General Appearance: Well Groomed  Eye Contact:  Good  Speech:  Clear and Coherent and Normal Rate  Volume:  Normal  Mood:  Euthymic  Affect:  Appropriate and Congruent  Thought Process:  Coherent, Goal Directed and Linear  Orientation:  Full (Time, Place, and Person)  Thought Content: WDL and Logical   Suicidal Thoughts:  No  Homicidal Thoughts:  No  Memory:   Immediate;   Good Recent;   Good Remote;   Good  Judgement:  Good  Insight:  Good  Psychomotor Activity:  Normal  Concentration:  Concentration: Good and Attention Span: Good  Recall:  Good  Fund of Knowledge: Good  Language: Good  Akathisia:  None  Handed:  Right  AIMS (if indicated): Not done  Assets:  Communication Skills Desire for Improvement Financial Resources/Insurance Housing Intimacy Social Support  ADL's:  Intact  Cognition: WNL  Sleep:  Good   Screenings: AUDIT    Advertising Copywriter from 11/28/2020 in [facility] Counselor from 04/16/2020 in [facility]  Alcohol Use Disorder Identification Test Final Score (AUDIT) 6 8   GAD-7    Flowsheet Row Video Visit from 03/16/2024 in [facility] Video Visit from 11/26/2023 in [facility] Video Visit from 09/24/2023 in [facility] Video Visit from 07/10/2023 in [facility] Video Visit from 05/08/2023 in [facility]  Total GAD-7 Score 6 1 21  0 2   PHQ2-9    Flowsheet Row Video Visit from 03/16/2024 in [facility] Video Visit from 11/26/2023 in [facility] Video Visit from 09/24/2023 in [facility] Video Visit from 07/10/2023 in [facility] Video Visit from 05/08/2023 in [facility]  PHQ-2 Total Score 1 0 6 0 0  PHQ-9 Total Score 3 1 21  0 2   Flowsheet Row Video Visit from 09/24/2023 in [facility] ED from 12/16/2021 in [facility] Emergency Department at [facility] Video Visit from 08/08/2021 in [facility]  C-SSRS RISK CATEGORY Error: Q7 should not be populated when Q6 is No No Risk Error: Question 6 not populated      Assessment and Plan: Patient reports that she has increased anxiety during the holiday season.Today gabapentin  100 mg 3 times daily increased to 300 mg 3 times daily to help manage anxiety.  She will continue her other medications as prescribed.  1. Bipolar I disorder, most recent episode depressed (HCC)  Increased- gabapentin  (NEURONTIN ) 300 MG capsule; Take 1 capsule (300 mg total) by mouth 3 (three) times daily.  Dispense: 90 capsule; Refill: 3 Continue- ARIPiprazole  (ABILIFY ) 20 MG tablet; Take 1 tablet (20 mg total) by mouth daily.  Dispense: 30 tablet; Refill: 3 Continue- lamoTRIgine  (LAMICTAL ) 150 MG tablet; Take 1 tablet (150 mg total) by mouth daily.  Dispense: 30 tablet; Refill: 3   Follow-up in 2 months Follow-up with therapy  Zane FORBES Bach, NP 05/18/2024, 9:04 AM

## 2024-05-30 NOTE — Progress Notes (Deleted)
° °  NEW GYNECOLOGY PATIENT Patient name: Sara Pearson MRN [id number]  Date of birth: 05/29/1986 Chief Complaint:   No chief complaint on file.     History:  Discussed the use of AI scribe software for clinical note transcription with the patient, who gave verbal consent to proceed.  History of Present Illness           Gynecologic History No LMP recorded. Contraception: {method:5051}   OB History  No obstetric history on file.     The following portions of the patient's history were reviewed and updated as appropriate: allergies, current medications, past family history, past medical history, past social history, past surgical history and problem list. Health Maintenance  Topic Date Due   HIV Screening  Never done   Hepatitis C Screening  Never done   DTaP/Tdap/Td vaccine (1 - Tdap) Never done   Hepatitis B Vaccine (1 of 3 - 19+ 3-dose series) Never done   HPV Vaccine (1 - 3-dose SCDM series) Never done   Pap with HPV screening  Never done   COVID-19 Vaccine (3 - Pfizer risk series) 10/25/2019   Flu Shot  Never done   Pneumococcal Vaccine  Aged Out   Meningitis B Vaccine  Aged Out     Review of Systems Pertinent items noted in HPI and remainder of comprehensive ROS otherwise negative.  Physical Exam:  There were no vitals taken for this visit. Physical Exam  04/12/2024 7:59 PM EDT  US  PELVIS TRANSVAGINAL WITH DOPPLER:  HISTORY: 38 years  old Female with Bleeding Pain.  TECHNIQUE: Multiple grayscale and color Doppler images of the pelvis were obtained.  COMPARISON: None available  FINDINGS: The uterus measures 8.3 x 3.4 x 3.6 cm. The endometrium measures 7.0 mm in thickness.  No myometrial mass is seen.  The right ovary measures 2.7 x 1.9 x 1.7 cm.  The left ovary measures 2.1 x 1.5 x 1.4 cm.  Spectral Doppler evaluation of the ovaries was obtained. Normal arterial waveforms were obtained from both ovaries.  No free fluid is seen in the cul-de-sac.     Assessment and Plan:  Assessment and Plan Assessment & Plan        Follow-up: No follow-ups on file.      Carter Quarry, MD Obstetrician & Gynecologist, Faculty Practice Minimally [facility] Gynec[facility] for [employer], [facility]

## 2024-06-01 ENCOUNTER — Encounter: Admitting: Obstetrics and Gynecology

## 2024-07-20 ENCOUNTER — Encounter (HOSPITAL_COMMUNITY): Payer: Self-pay | Admitting: Psychiatry

## 2024-07-20 ENCOUNTER — Telehealth (INDEPENDENT_AMBULATORY_CARE_PROVIDER_SITE_OTHER): Admitting: Psychiatry

## 2024-07-20 DIAGNOSIS — F3130 Bipolar disorder, current episode depressed, mild or moderate severity, unspecified: Secondary | ICD-10-CM | POA: Diagnosis not present

## 2024-07-20 MED ORDER — GABAPENTIN 300 MG PO CAPS: 300.0000 mg | ORAL_CAPSULE | Freq: Three times a day (TID) | ORAL | 3 refills | Status: AC

## 2024-07-20 MED ORDER — ARIPIPRAZOLE 20 MG PO TABS: 20.0000 mg | ORAL_TABLET | Freq: Every day | ORAL | 3 refills | Status: AC

## 2024-07-20 MED ORDER — LAMOTRIGINE 150 MG PO TABS: 150.0000 mg | ORAL_TABLET | Freq: Every day | ORAL | 3 refills | Status: AC

## 2024-07-20 NOTE — Progress Notes (Signed)
 BH MD/PA/NP OP Progress Note Virtual Visit via Video Note  I connected with Sara Pearson on 07/20/24 at  2:30 PM EST by a video enabled telemedicine application and verified that I am speaking with the correct person using two identifiers.  Location: Patient: Home Provider: Clinic   I discussed the limitations of evaluation and management by telemedicine and the availability of in person appointments. The patient expressed understanding and agreed to proceed.  I provided 30 minutes of non-face-to-face time during this encounter.      07/20/2024 12:50 PM Sara Pearson  MRN:  [id number]  Chief Complaint: My depression is worse because of the state of the world  HPI: 39 year old female seen today for followup psychiatric evaluation. She has a psychiatric history of Bipolar 1, anxiety, depression, and PTSD. She is currently managed on Abilify  20 mg daily, Lamictal  150 mg daily, and gabapentin  300 mg three times daily.  She reports that her medications are effective in managing her psychiatric conditions.      Today she was well-groomed, pleasant, cooperative, and engaged in conversation.  She informed clinical research associate that her depression has worsened because of the state of the world.  Patient notes that she is concerned about society.  Despite this she notes that she is mentally in a good place. Today provider conducted a GAD-7 and patient scored 2, at her last visit she scored a an 11.  Provider also conducted PHQ-9 the patient scored a 2, at her last visit she scored a.  She endorses adequate sleep and appetite. Today she denies SI/HI/AVH.    Patient reports that she continues to have menorrhagia.  She notes that she was not able to see an OB/GYN because it was too expensive.  Despite above stressors patient notes that she is able to cope.  No medication changes made today.  Patient agreeable to continue medication as prescribed.  No other concerns at this time.      Visit Diagnosis:  No  diagnosis found.       Past Psychiatric History:  Bipolar 1, anxiety, depression, and PTSD.  Past Medical History:  Past Medical History:  Diagnosis Date   Bipolar 1 disorder (HCC)    Heel spur    Hypertension    Osteogenesis imperfecta    PCOS (polycystic ovarian syndrome)    No past surgical history on file.  Family Psychiatric History: Unknown    Family History: No family history on file.  Social History:  Social History   Socioeconomic History   Marital status: Married    Spouse name: Not on file   Number of children: Not on file   Years of education: Not on file   Highest education level: Not on file  Occupational History   Not on file  Tobacco Use   Smoking status: Never   Smokeless tobacco: Never  Vaping Use   Vaping status: Every Day  Substance and Sexual Activity   Alcohol use: Yes   Drug use: Never   Sexual activity: Yes  Other Topics Concern   Not on file  Social History Narrative   Not on file   Social Drivers of Health   Tobacco Use: Low Risk (05/18/2024)   Patient History    Smoking Tobacco Use: Never    Smokeless Tobacco Use: Never    Passive Exposure: Not on file  Financial Resource Strain: Not on file  Food Insecurity: Not on file  Transportation Needs: Not on file  Physical Activity: Not  on file  Stress: Not on file  Social Connections: Unknown (10/29/2021)   Received from [facility]   Social Network    Social Network: Not on file  Depression [phone]): Low Risk (05/18/2024)   Depression (PHQ2-9)    PHQ-2 Score: 2  Alcohol Screen: Not on file  Housing: Not on file  Utilities: Not on file  Health Literacy: Not on file    Allergies:  Allergies  Allergen Reactions   Codeine    Iodine    Rocephin [Ceftriaxone Sodium In Dextrose]    Zofran [Ondansetron Hcl]     Metabolic Disorder Labs: Lab Results  Component Value Date   HGBA1C 4.9 09/02/2023   Lab Results  Component Value Date   PROLACTIN CANCELED 09/02/2023    Lab Results  Component Value Date   CHOL CANCELED 09/02/2023   TRIG CANCELED 09/02/2023   HDL CANCELED 09/02/2023   CHOLHDL CANCELED 09/02/2023   LDLCALC CANCELED 09/02/2023   Lab Results  Component Value Date   TSH CANCELED 09/02/2023   TSH 0.580 10/22/2020    Therapeutic Level Labs: No results found for: LITHIUM Lab Results  Component Value Date   VALPROATE 27 (L) 05/04/2020   No results found for: CBMZ  Current Medications: Current Outpatient Medications  Medication Sig Dispense Refill   ARIPiprazole  (ABILIFY ) 20 MG tablet Take 1 tablet (20 mg total) by mouth daily. 30 tablet 3   gabapentin  (NEURONTIN ) 300 MG capsule Take 1 capsule (300 mg total) by mouth 3 (three) times daily. 90 capsule 3   lamoTRIgine  (LAMICTAL ) 150 MG tablet Take 1 tablet (150 mg total) by mouth daily. 30 tablet 3   Norgestimate -Ethinyl Estradiol Triphasic (ORTHO TRI-CYCLEN LO) 0.18/0.215/0.25 MG-25 MCG tab Take 1 tablet by mouth daily. 28 tablet 11   No current facility-administered medications for this visit.     Musculoskeletal: Strength & Muscle Tone: within normal limits and  telehealth visit Gait & Station: normal,  telehealth visit Patient leans: N/A  Psychiatric Specialty Exam: Review of Systems  There were no vitals taken for this visit.There is no height or weight on file to calculate BMI.  General Appearance: Well Groomed  Eye Contact:  Good  Speech:  Clear and Coherent and Normal Rate  Volume:  Normal  Mood:  Euthymic  Affect:  Appropriate and Congruent  Thought Process:  Coherent, Goal Directed and Linear  Orientation:  Full (Time, Place, and Person)  Thought Content: WDL and Logical   Suicidal Thoughts:  No  Homicidal Thoughts:  No  Memory:  Immediate;   Good Recent;   Good Remote;   Good  Judgement:  Good  Insight:  Good  Psychomotor Activity:  Normal  Concentration:  Concentration: Good and Attention Span: Good  Recall:  Good  Fund of Knowledge: Good   Language: Good  Akathisia:  None  Handed:  Right  AIMS (if indicated): Not done  Assets:  Communication Skills Desire for Improvement Financial Resources/Insurance Housing Intimacy Social Support  ADL's:  Intact  Cognition: WNL  Sleep:  Good   Screenings: AUDIT    Advertising Copywriter from 11/28/2020 in [facility] Counselor from 04/16/2020 in [facility]  Alcohol Use Disorder Identification Test Final Score (AUDIT) 6 8   GAD-7    Flowsheet Row Video Visit from 05/18/2024 in [facility] Video Visit from 03/16/2024 in [facility] Video Visit from 11/26/2023 in [facility] Video Visit from 09/24/2023  in [facility] Video Visit from 07/10/2023 in [facility]  Total GAD-7 Score 11 6 1 21  0   PHQ2-9    Flowsheet Row Video Visit from 05/18/2024 in [facility] Video Visit from 03/16/2024 in [facility] Video Visit from 11/26/2023 in [facility] Video Visit from 09/24/2023 in [facility] Video Visit from 07/10/2023 in [facility]  PHQ-2 Total Score 0 1 0 6 0  PHQ-9 Total Score 2 3 1 21  0   Flowsheet Row Video Visit from 09/24/2023 in [facility] ED from 12/16/2021 in [facility] Video Visit from 08/08/2021 in [facility]  C-SSRS RISK CATEGORY Error: Q7 should not be populated when Q6 is No No Risk Error: Question 6 not populated     Assessment and Plan: Patient reports that she is concerned about the state of the world.  Despite this she is able to cope.  No medication changes made today.  Patient agreeable to continue medication as prescribed. 1. Bipolar I  disorder, most recent episode depressed (HCC)  Continue- gabapentin  (NEURONTIN ) 300 MG capsule; Take 1 capsule (300 mg total) by mouth 3 (three) times daily.  Dispense: 90 capsule; Refill: 3 Continue- ARIPiprazole  (ABILIFY ) 20 MG tablet; Take 1 tablet (20 mg total) by mouth daily.  Dispense: 30 tablet; Refill: 3 Continue- lamoTRIgine  (LAMICTAL ) 150 MG tablet; Take 1 tablet (150 mg total) by mouth daily.  Dispense: 30 tablet; Refill: 3   Follow-up in 2 months Follow-up with therapy  Zane FORBES Bach, NP 07/20/2024, 12:50 PM

## 2024-09-28 ENCOUNTER — Telehealth (HOSPITAL_COMMUNITY): Admitting: Psychiatry
